# Patient Record
Sex: Male | Born: 1985 | Race: White | Hispanic: No | Marital: Married | Smoking: Never smoker
Health system: Southern US, Community
[De-identification: ages and names within clinical notes are randomized; demographics above are authoritative.]

## PROBLEM LIST (undated history)

## (undated) HISTORY — PX: SKIN SURGERY: SHX2413

---
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
## 2005-03-28 ENCOUNTER — Emergency Department (HOSPITAL_COMMUNITY): Admission: EM | Admit: 2005-03-28 | Discharge: 2005-03-29 | Payer: Self-pay | Admitting: Emergency Medicine

## 2006-03-06 ENCOUNTER — Ambulatory Visit (HOSPITAL_COMMUNITY): Admission: RE | Admit: 2006-03-06 | Discharge: 2006-03-06 | Payer: Self-pay | Admitting: Family Medicine

## 2006-11-12 IMAGING — CR DG NASAL BONES 3+V
3 series · 3 of 3 positions shown · non-contrast
Comparison: none

CLINICAL DATA: Injury.
 NASAL BONE SERIES:
 No fracture is identified.  Nose appears swollen at the bridge.

[view not recorded (1 of 3)]
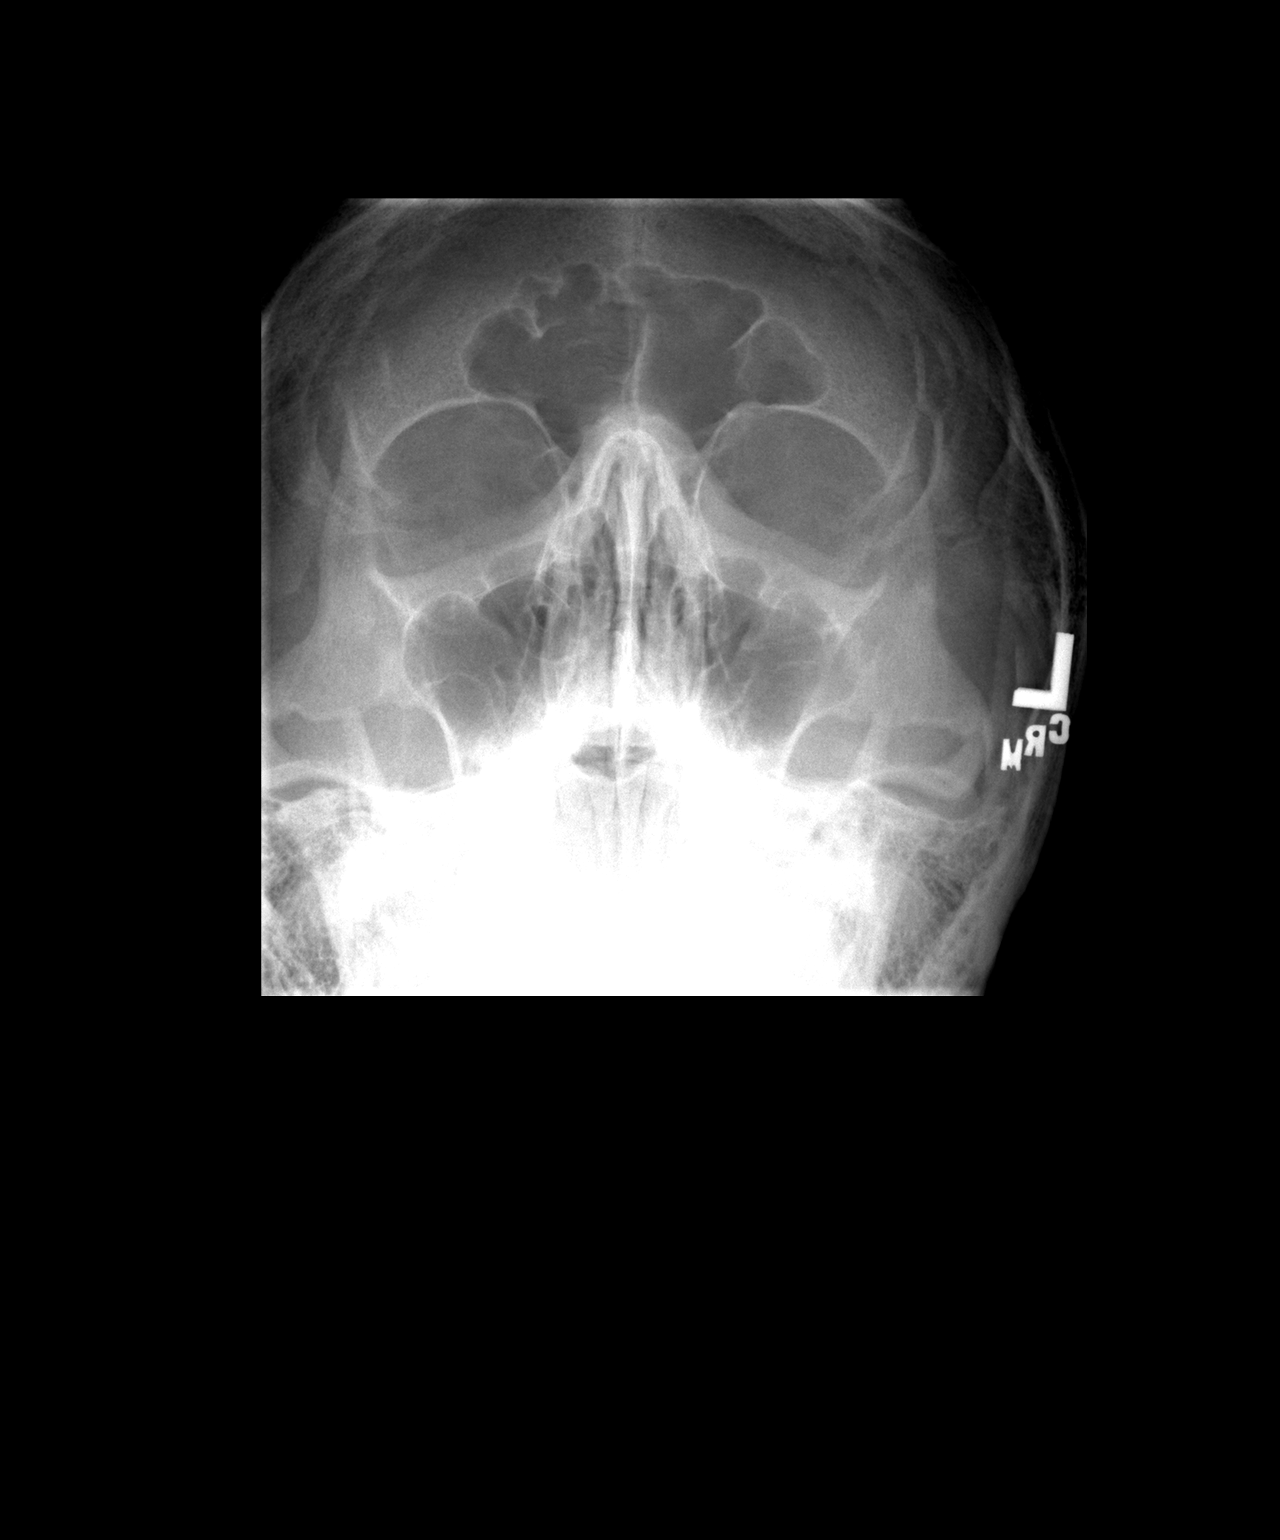

[view not recorded (2 of 3)]
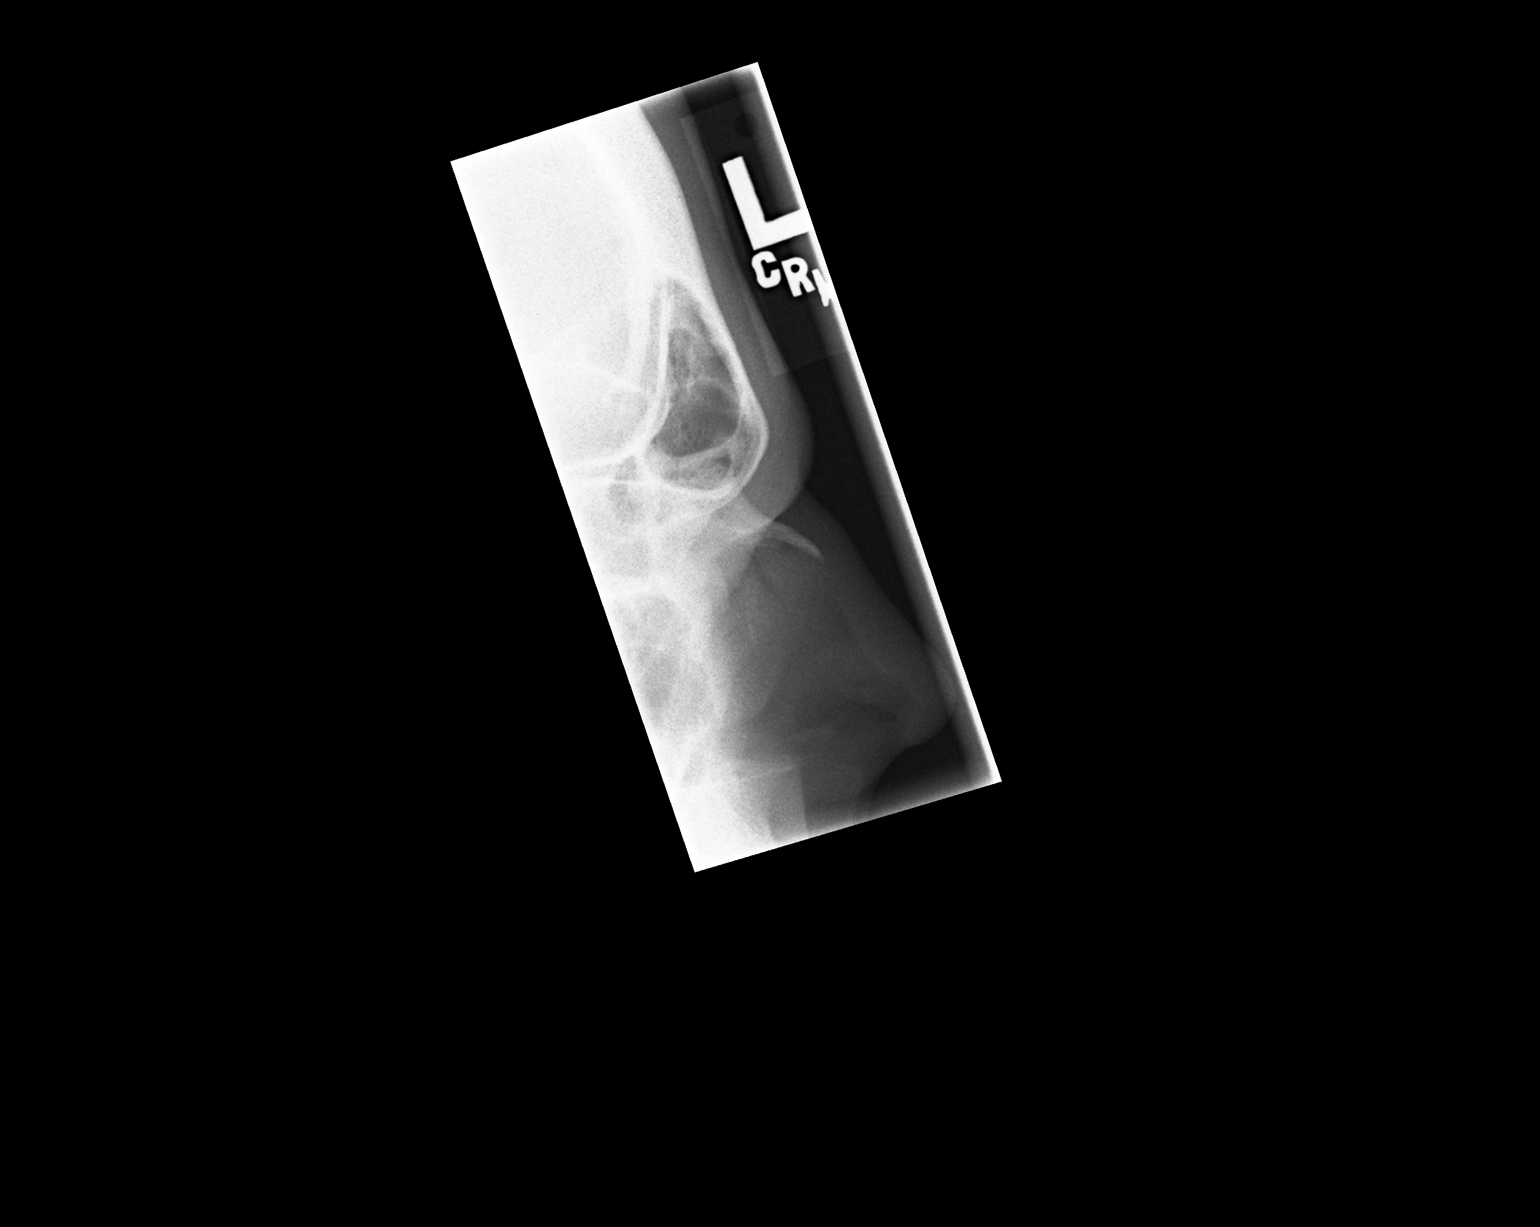

[view not recorded (3 of 3)]
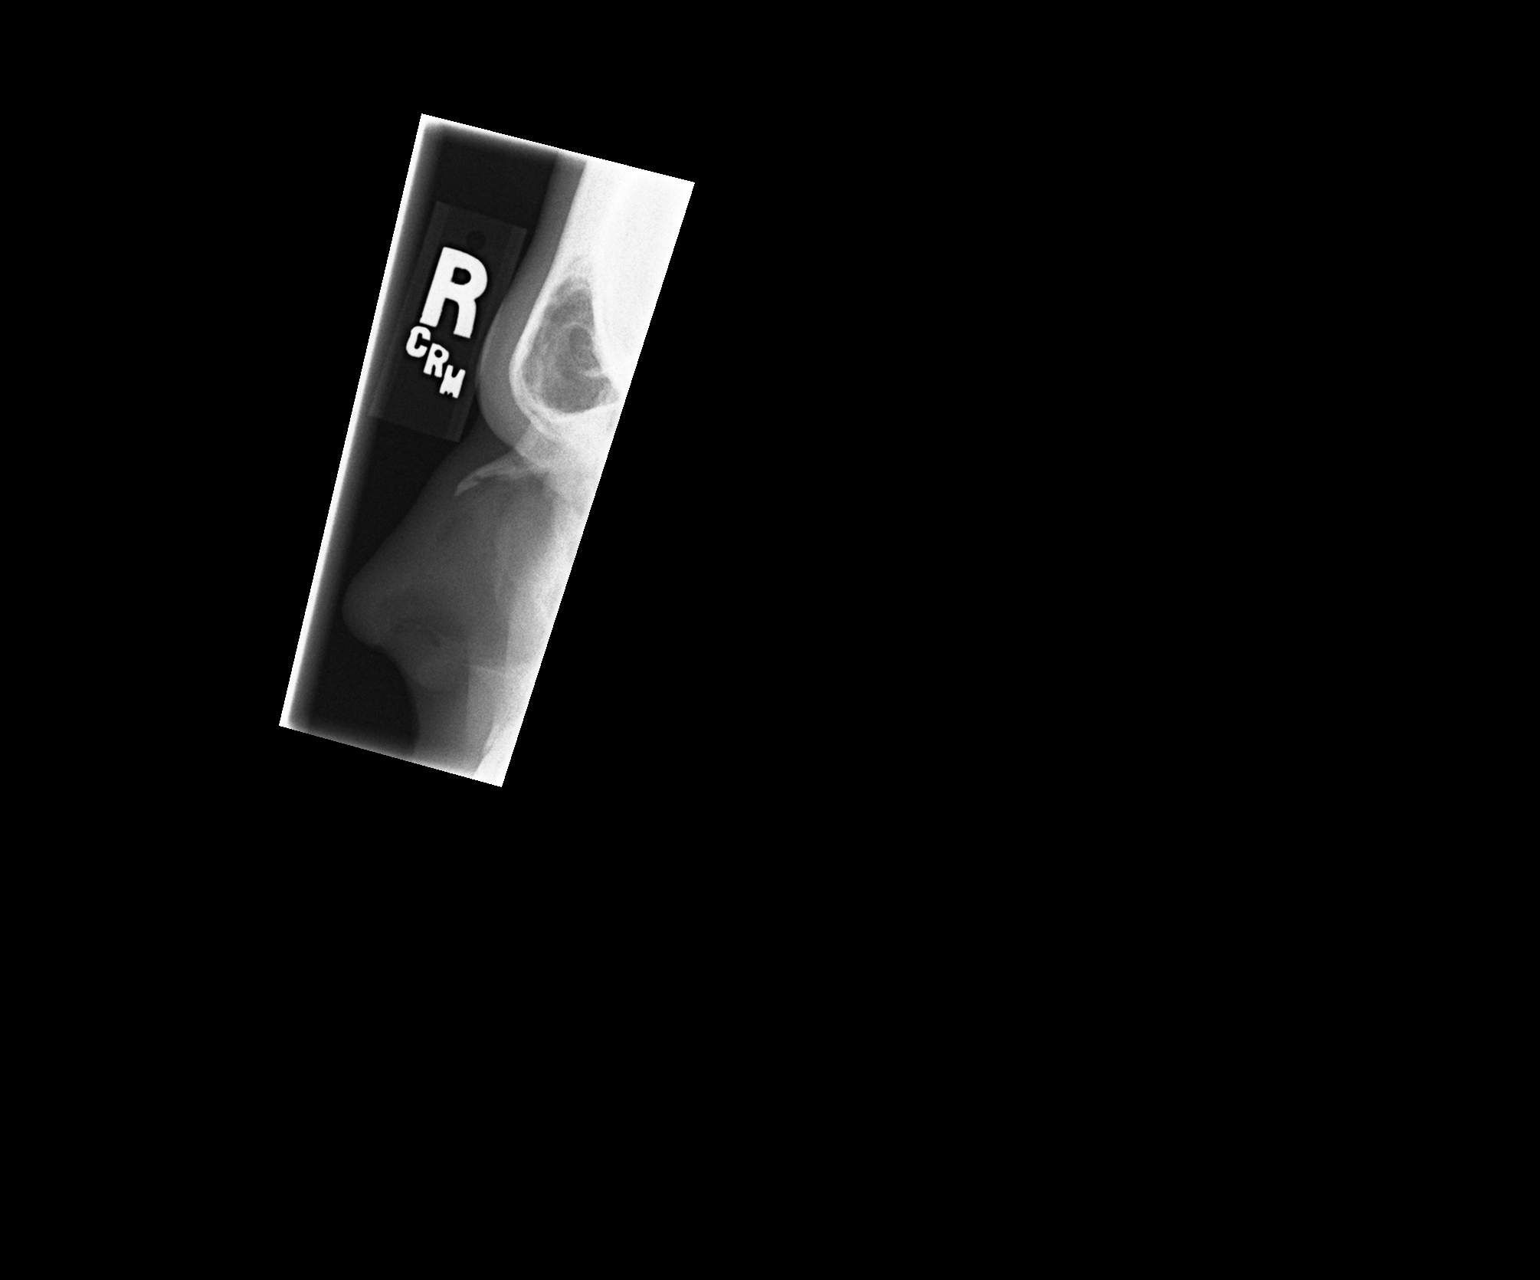

[3 of 3 positions shown; findings below may reference images not displayed]

IMPRESSION: Negative for fracture.

## 2011-04-04 ENCOUNTER — Other Ambulatory Visit (HOSPITAL_COMMUNITY): Payer: Self-pay | Admitting: Family Medicine

## 2011-04-04 DIAGNOSIS — R55 Syncope and collapse: Secondary | ICD-10-CM

## 2011-04-06 ENCOUNTER — Other Ambulatory Visit (HOSPITAL_COMMUNITY): Payer: Self-pay

## 2011-04-07 ENCOUNTER — Ambulatory Visit (HOSPITAL_COMMUNITY)
Admission: RE | Admit: 2011-04-07 | Discharge: 2011-04-07 | Disposition: A | Discharge status: Home / Self Care | Payer: BC Managed Care – PPO | Source: Ambulatory Visit | Attending: Family Medicine | Admitting: Family Medicine

## 2011-04-07 DIAGNOSIS — R42 Dizziness and giddiness: Secondary | ICD-10-CM | POA: Insufficient documentation

## 2011-04-07 DIAGNOSIS — R55 Syncope and collapse: Secondary | ICD-10-CM | POA: Insufficient documentation

## 2011-04-07 DIAGNOSIS — R51 Headache: Secondary | ICD-10-CM | POA: Insufficient documentation

## 2011-04-07 MED ORDER — GADOBENATE DIMEGLUMINE 529 MG/ML IV SOLN: 20.0000 mL | Freq: Once | INTRAVENOUS | Status: AC | PRN

## 2011-04-07 MED ORDER — GADOBENATE DIMEGLUMINE 529 MG/ML IV SOLN: 15.0000 mL | Freq: Once | INTRAVENOUS | Status: AC | PRN

## 2011-05-13 ENCOUNTER — Ambulatory Visit (HOSPITAL_COMMUNITY)
Admission: RE | Admit: 2011-05-13 | Payer: BC Managed Care – PPO | Source: Ambulatory Visit | Admitting: Cardiovascular Disease

## 2011-07-20 ENCOUNTER — Other Ambulatory Visit (HOSPITAL_COMMUNITY): Payer: Self-pay | Admitting: Family Medicine

## 2011-07-20 DIAGNOSIS — D485 Neoplasm of uncertain behavior of skin: Secondary | ICD-10-CM

## 2011-07-22 ENCOUNTER — Other Ambulatory Visit (HOSPITAL_COMMUNITY): Payer: Self-pay | Admitting: Family Medicine

## 2011-07-22 DIAGNOSIS — D485 Neoplasm of uncertain behavior of skin: Secondary | ICD-10-CM

## 2011-07-25 ENCOUNTER — Other Ambulatory Visit (HOSPITAL_COMMUNITY): Payer: BC Managed Care – PPO

## 2011-07-29 ENCOUNTER — Ambulatory Visit (HOSPITAL_COMMUNITY)
Admission: RE | Admit: 2011-07-29 | Discharge: 2011-07-29 | Disposition: A | Discharge status: Home / Self Care | Payer: BC Managed Care – PPO | Source: Ambulatory Visit | Attending: Family Medicine | Admitting: Family Medicine

## 2011-07-29 ENCOUNTER — Other Ambulatory Visit (HOSPITAL_COMMUNITY): Payer: Self-pay | Admitting: Family Medicine

## 2011-07-29 ENCOUNTER — Other Ambulatory Visit (HOSPITAL_COMMUNITY): Payer: BC Managed Care – PPO

## 2011-07-29 DIAGNOSIS — D485 Neoplasm of uncertain behavior of skin: Secondary | ICD-10-CM

## 2011-07-29 DIAGNOSIS — R1909 Other intra-abdominal and pelvic swelling, mass and lump: Secondary | ICD-10-CM | POA: Insufficient documentation

## 2011-08-02 ENCOUNTER — Other Ambulatory Visit (HOSPITAL_COMMUNITY): Payer: BC Managed Care – PPO

## 2012-08-01 DIAGNOSIS — G8918 Other acute postprocedural pain: Secondary | ICD-10-CM | POA: Insufficient documentation

## 2012-08-01 NOTE — ED Notes (Addendum)
Pt states that he had surgery on Tuesday at [facility] for removal of "fatty tissue", has two drains in place, pt states that the drain on the left side started pulling at the site of the stitches tonight and is not draining as much. Pt spoke with Dr. Odis Luster who advises that he would meet pt in office tomorrow to see about removing drains. Pt states that he did not think he could wait that long.  Pt states that he has been experiencing some sob today.

## 2012-08-02 ENCOUNTER — Encounter (HOSPITAL_COMMUNITY): Payer: Self-pay | Admitting: *Deleted

## 2012-08-02 ENCOUNTER — Emergency Department (HOSPITAL_COMMUNITY)
Admission: EM | Admit: 2012-08-02 | Discharge: 2012-08-02 | Disposition: A | Discharge status: Home / Self Care | Payer: BC Managed Care – PPO | Attending: Emergency Medicine | Admitting: Emergency Medicine

## 2012-08-02 DIAGNOSIS — G8918 Other acute postprocedural pain: Secondary | ICD-10-CM

## 2012-08-02 MED ORDER — HYDROCODONE-ACETAMINOPHEN 5-325 MG PO TABS
1.0000 | ORAL_TABLET | Freq: Once | ORAL | Status: AC
  Administered 2012-08-02: 1 via ORAL
  Filled 2012-08-02: qty 1

## 2012-08-02 NOTE — ED Notes (Signed)
Pt states some increased abd pain, states it has been a while since his last dose of pain medication before he came here. Dr Deretha Emory aware and in room assessing pt at this time.

## 2012-08-02 NOTE — ED Provider Notes (Signed)
History     CSN: [id number]  Arrival date & time 08/01/12  2354   First MD Initiated Contact with Patient 08/02/12 [phone]      Chief Complaint  Patient presents with  . JP drain issue     (Consider location/radiation/quality/duration/timing/severity/associated sxs/prior treatment) The history is provided by the patient and a parent.  patient is a 26 year old male status post plastic surgery to the chest on Tuesday by Dr. Odis Luster plastic surgeon in [place]. Patient has a compression dressing in place and 2 JP drains. Patient complaining of pain where the left JP drain is. No fever no trouble breathing no shortness of breath no bowel pain no nausea no vomiting. Pain is described as 5/10 , sharp and intermittent. Both JP drains are still functioning.  History reviewed. No pertinent past medical history.  Past Surgical History  Procedure Date  . Skin surgery     removal of fatty tissue.     No family history on file.  History  Substance Use Topics  . Smoking status: Never Smoker   . Smokeless tobacco: Not on file  . Alcohol Use: No      Review of Systems  Constitutional: Negative for fever.  HENT: Negative for neck pain.   Respiratory: Negative for shortness of breath.   Cardiovascular: Positive for chest pain.  Gastrointestinal: Negative for nausea, vomiting and abdominal pain.  Genitourinary: Negative for dysuria.  Musculoskeletal: Negative for back pain.  Skin: Negative for rash.  Neurological: Negative for headaches.  Hematological: Does not bruise/bleed easily.    Allergies  Review of patient's allergies indicates no known allergies.  Home Medications   Current Outpatient Rx  Name Route Sig Dispense Refill  . ACETAMINOPHEN 500 MG PO TABS Oral Take 500 mg by mouth as needed.    Marland Kitchen HYDROMORPHONE HCL 2 MG PO TABS Oral Take 2 mg by mouth every 4 (four) hours as needed.    . METHOCARBAMOL 500 MG PO TABS Oral Take 500 mg by mouth as needed.      BP 103/73   Pulse 83  Temp 98.9 F (37.2 C)  Resp 20  Ht 6' (1.829 m)  Wt 193 lb (87.544 kg)  BMI 26.18 kg/m2  SpO2 96%  Physical Exam  Nursing note and vitals reviewed. Constitutional: He is oriented to person, place, and time. He appears well-developed and well-nourished.  HENT:  Head: Normocephalic and atraumatic.  Mouth/Throat: Oropharynx is clear and moist.  Eyes: Conjunctivae and EOM are normal. Pupils are equal, round, and reactive to light.  Neck: Normal range of motion. Neck supple.  Cardiovascular: Normal rate, regular rhythm and normal heart sounds.   No murmur heard. Pulmonary/Chest: Effort normal. No respiratory distress. He has no wheezes. He has no rales.       Compression dressing around the chest. No blood drainage on the dressing. Both JP drains in place and functioning. No erythema on the edges of the dressing.  Abdominal: Soft. Bowel sounds are normal.  Musculoskeletal: Normal range of motion.  Neurological: He is alert and oriented to person, place, and time. No cranial nerve deficit. He exhibits normal muscle tone. Coordination normal.  Skin: No erythema.    ED Course  Procedures (including critical care time)  Labs Reviewed - No data to display No results found.   1. Chest wall pain following surgery       MDM   Patient status post chest wall breast modification by plastic surgery just on Tuesday less than 2 days  ago. 2 JP drains in place compression dressing in place. Patient complaining of discomfort to the left side both drains are still draining properly. No fever lungs are clear no obvious evidence of infection however dressing not removed due to the recent surgery and patient has followup with his plastic surgeon later on today.  Patient was given hydromorphone to take for pain however is only taking Tylenol recommended he take that as directed we'll supplement with a dose of hydrocodone here before going home.        Shelda Jakes,  MD 08/02/12 [phone]

## 2013-03-14 IMAGING — US US ABDOMEN LIMITED
1 series · 2 of 2 positions shown · non-contrast
Comparison: None

CLINICAL DATA: [DATE], lump in anterior abdominal wall left of
midline, history of skin cancer

LIMITED ABDOMINAL ULTRASOUND

[Series 1: us abdomen limited · 0.08mm/px · 2 of 2 slices shown]
[im 1/2]
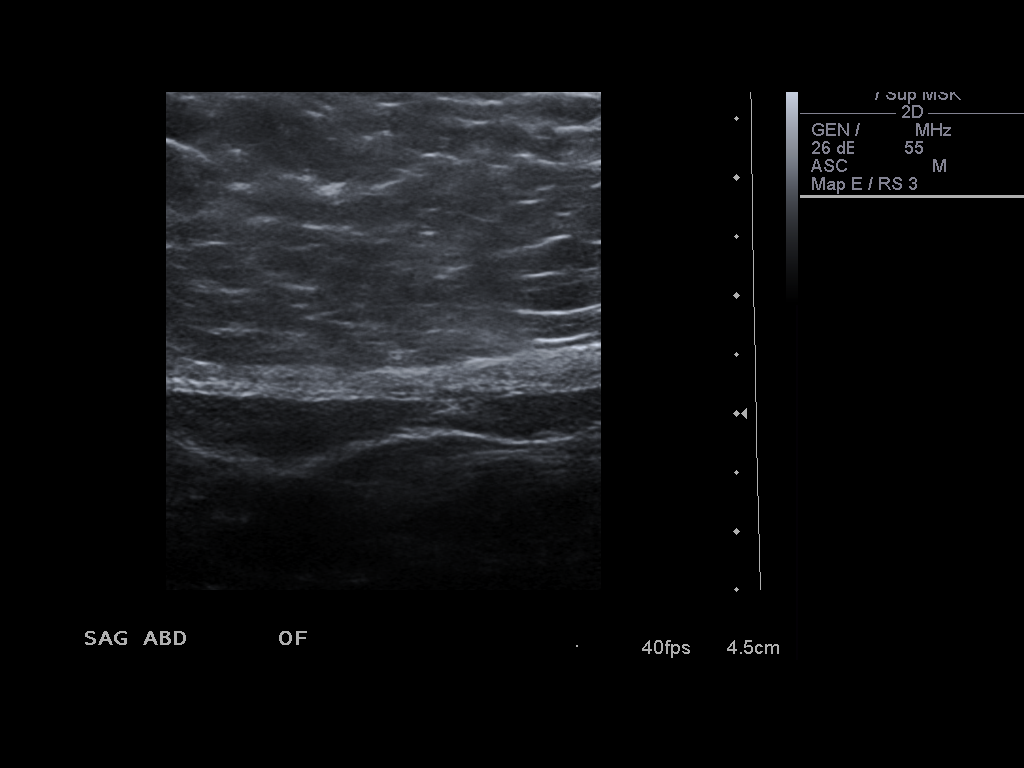
[im 2/2]
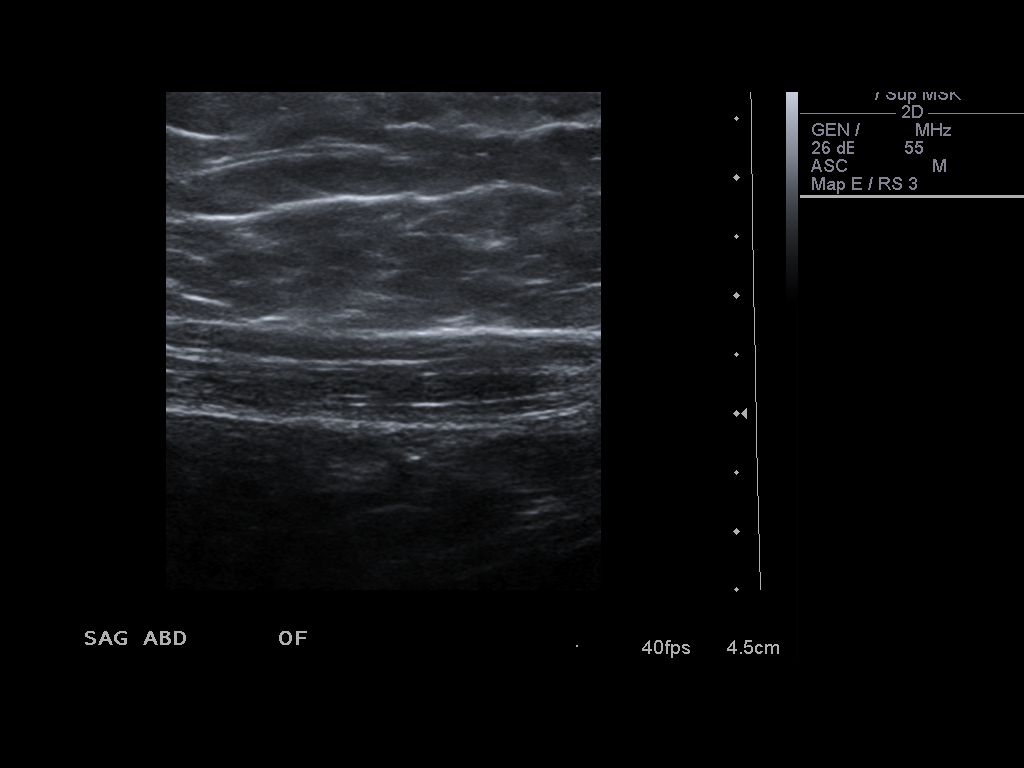

[2 of 2 positions shown; findings below may reference images not displayed]

FINDINGS: Sonography of the area of clinical concern left of the umbilicus
performed.
At this site, no focal soft tissue abnormality identified.
No evidence of calcification, cyst, or soft tissue mass.
Subcutaneous fat planes appear unremarkable and underlying fascia
appears intact.
IMPRESSION: Negative ultrasound exam of the site of clinical concern at the
anterior abdominal wall left of the umbilicus.

## 2013-05-20 ENCOUNTER — Encounter (INDEPENDENT_AMBULATORY_CARE_PROVIDER_SITE_OTHER): Payer: BC Managed Care – PPO | Admitting: Ophthalmology

## 2013-05-20 DIAGNOSIS — H35419 Lattice degeneration of retina, unspecified eye: Secondary | ICD-10-CM

## 2013-05-20 DIAGNOSIS — Q143 Congenital malformation of choroid: Secondary | ICD-10-CM

## 2013-05-20 DIAGNOSIS — H33309 Unspecified retinal break, unspecified eye: Secondary | ICD-10-CM

## 2013-05-20 DIAGNOSIS — H43819 Vitreous degeneration, unspecified eye: Secondary | ICD-10-CM

## 2013-05-24 ENCOUNTER — Encounter (INDEPENDENT_AMBULATORY_CARE_PROVIDER_SITE_OTHER): Payer: BC Managed Care – PPO | Admitting: Ophthalmology

## 2013-05-24 DIAGNOSIS — H33309 Unspecified retinal break, unspecified eye: Secondary | ICD-10-CM

## 2013-06-14 ENCOUNTER — Ambulatory Visit (INDEPENDENT_AMBULATORY_CARE_PROVIDER_SITE_OTHER): Payer: BC Managed Care – PPO | Admitting: Ophthalmology

## 2013-06-14 DIAGNOSIS — H33309 Unspecified retinal break, unspecified eye: Secondary | ICD-10-CM

## 2013-10-18 ENCOUNTER — Ambulatory Visit (INDEPENDENT_AMBULATORY_CARE_PROVIDER_SITE_OTHER): Payer: BC Managed Care – PPO | Admitting: Ophthalmology

## 2013-11-15 ENCOUNTER — Ambulatory Visit (INDEPENDENT_AMBULATORY_CARE_PROVIDER_SITE_OTHER): Payer: BC Managed Care – PPO | Admitting: Ophthalmology

## 2013-11-15 DIAGNOSIS — H33309 Unspecified retinal break, unspecified eye: Secondary | ICD-10-CM

## 2013-11-15 DIAGNOSIS — H43819 Vitreous degeneration, unspecified eye: Secondary | ICD-10-CM

## 2014-12-05 ENCOUNTER — Ambulatory Visit (INDEPENDENT_AMBULATORY_CARE_PROVIDER_SITE_OTHER): Payer: BLUE CROSS/BLUE SHIELD | Admitting: Ophthalmology

## 2014-12-05 DIAGNOSIS — H33303 Unspecified retinal break, bilateral: Secondary | ICD-10-CM

## 2014-12-05 DIAGNOSIS — H35413 Lattice degeneration of retina, bilateral: Secondary | ICD-10-CM

## 2014-12-05 DIAGNOSIS — H43813 Vitreous degeneration, bilateral: Secondary | ICD-10-CM

## 2014-12-05 DIAGNOSIS — D3132 Benign neoplasm of left choroid: Secondary | ICD-10-CM

## 2015-12-11 ENCOUNTER — Ambulatory Visit (INDEPENDENT_AMBULATORY_CARE_PROVIDER_SITE_OTHER): Payer: BLUE CROSS/BLUE SHIELD | Admitting: Ophthalmology

## 2016-01-01 ENCOUNTER — Ambulatory Visit (INDEPENDENT_AMBULATORY_CARE_PROVIDER_SITE_OTHER): Payer: BLUE CROSS/BLUE SHIELD | Admitting: Ophthalmology

## 2016-01-01 DIAGNOSIS — H43813 Vitreous degeneration, bilateral: Secondary | ICD-10-CM

## 2016-01-01 DIAGNOSIS — D3132 Benign neoplasm of left choroid: Secondary | ICD-10-CM | POA: Diagnosis not present

## 2016-01-01 DIAGNOSIS — H33303 Unspecified retinal break, bilateral: Secondary | ICD-10-CM | POA: Diagnosis not present

## 2016-01-01 DIAGNOSIS — H35413 Lattice degeneration of retina, bilateral: Secondary | ICD-10-CM

## 2016-04-15 DIAGNOSIS — J069 Acute upper respiratory infection, unspecified: Secondary | ICD-10-CM | POA: Diagnosis not present

## 2016-04-15 DIAGNOSIS — Z1389 Encounter for screening for other disorder: Secondary | ICD-10-CM | POA: Diagnosis not present

## 2016-04-15 DIAGNOSIS — R070 Pain in throat: Secondary | ICD-10-CM | POA: Diagnosis not present

## 2016-04-15 DIAGNOSIS — Z6826 Body mass index (BMI) 26.0-26.9, adult: Secondary | ICD-10-CM | POA: Diagnosis not present

## 2016-04-15 DIAGNOSIS — J343 Hypertrophy of nasal turbinates: Secondary | ICD-10-CM | POA: Diagnosis not present

## 2017-01-13 ENCOUNTER — Ambulatory Visit (INDEPENDENT_AMBULATORY_CARE_PROVIDER_SITE_OTHER): Payer: BLUE CROSS/BLUE SHIELD | Admitting: Ophthalmology

## 2017-01-13 DIAGNOSIS — H33303 Unspecified retinal break, bilateral: Secondary | ICD-10-CM | POA: Diagnosis not present

## 2017-01-13 DIAGNOSIS — D3132 Benign neoplasm of left choroid: Secondary | ICD-10-CM

## 2017-01-13 DIAGNOSIS — H43813 Vitreous degeneration, bilateral: Secondary | ICD-10-CM

## 2017-01-31 DIAGNOSIS — Z1389 Encounter for screening for other disorder: Secondary | ICD-10-CM | POA: Diagnosis not present

## 2017-01-31 DIAGNOSIS — Z6826 Body mass index (BMI) 26.0-26.9, adult: Secondary | ICD-10-CM | POA: Diagnosis not present

## 2017-01-31 DIAGNOSIS — Z0001 Encounter for general adult medical examination with abnormal findings: Secondary | ICD-10-CM | POA: Diagnosis not present

## 2017-01-31 DIAGNOSIS — E663 Overweight: Secondary | ICD-10-CM | POA: Diagnosis not present

## 2017-02-02 DIAGNOSIS — E663 Overweight: Secondary | ICD-10-CM | POA: Diagnosis not present

## 2017-02-02 DIAGNOSIS — J069 Acute upper respiratory infection, unspecified: Secondary | ICD-10-CM | POA: Diagnosis not present

## 2017-02-02 DIAGNOSIS — Z6827 Body mass index (BMI) 27.0-27.9, adult: Secondary | ICD-10-CM | POA: Diagnosis not present

## 2017-03-28 DIAGNOSIS — J069 Acute upper respiratory infection, unspecified: Secondary | ICD-10-CM | POA: Diagnosis not present

## 2017-08-11 DIAGNOSIS — F419 Anxiety disorder, unspecified: Secondary | ICD-10-CM | POA: Diagnosis not present

## 2017-08-11 DIAGNOSIS — Z1389 Encounter for screening for other disorder: Secondary | ICD-10-CM | POA: Diagnosis not present

## 2017-08-11 DIAGNOSIS — Z6827 Body mass index (BMI) 27.0-27.9, adult: Secondary | ICD-10-CM | POA: Diagnosis not present

## 2017-08-11 DIAGNOSIS — E663 Overweight: Secondary | ICD-10-CM | POA: Diagnosis not present

## 2018-01-29 ENCOUNTER — Ambulatory Visit (INDEPENDENT_AMBULATORY_CARE_PROVIDER_SITE_OTHER): Payer: BLUE CROSS/BLUE SHIELD | Admitting: Ophthalmology

## 2018-01-29 DIAGNOSIS — H33303 Unspecified retinal break, bilateral: Secondary | ICD-10-CM | POA: Diagnosis not present

## 2018-01-29 DIAGNOSIS — D3132 Benign neoplasm of left choroid: Secondary | ICD-10-CM | POA: Diagnosis not present

## 2018-01-29 DIAGNOSIS — H43813 Vitreous degeneration, bilateral: Secondary | ICD-10-CM | POA: Diagnosis not present

## 2018-06-13 DIAGNOSIS — R197 Diarrhea, unspecified: Secondary | ICD-10-CM | POA: Diagnosis not present

## 2018-06-13 DIAGNOSIS — Z1389 Encounter for screening for other disorder: Secondary | ICD-10-CM | POA: Diagnosis not present

## 2018-06-13 DIAGNOSIS — Z6828 Body mass index (BMI) 28.0-28.9, adult: Secondary | ICD-10-CM | POA: Diagnosis not present

## 2018-06-13 DIAGNOSIS — E663 Overweight: Secondary | ICD-10-CM | POA: Diagnosis not present

## 2018-08-14 DIAGNOSIS — S338XXA Sprain of other parts of lumbar spine and pelvis, initial encounter: Secondary | ICD-10-CM | POA: Diagnosis not present

## 2018-08-14 DIAGNOSIS — S233XXA Sprain of ligaments of thoracic spine, initial encounter: Secondary | ICD-10-CM | POA: Diagnosis not present

## 2018-08-14 DIAGNOSIS — S134XXA Sprain of ligaments of cervical spine, initial encounter: Secondary | ICD-10-CM | POA: Diagnosis not present

## 2018-08-21 DIAGNOSIS — S233XXA Sprain of ligaments of thoracic spine, initial encounter: Secondary | ICD-10-CM | POA: Diagnosis not present

## 2018-08-21 DIAGNOSIS — S134XXA Sprain of ligaments of cervical spine, initial encounter: Secondary | ICD-10-CM | POA: Diagnosis not present

## 2018-08-21 DIAGNOSIS — S338XXA Sprain of other parts of lumbar spine and pelvis, initial encounter: Secondary | ICD-10-CM | POA: Diagnosis not present

## 2018-08-22 DIAGNOSIS — Z0001 Encounter for general adult medical examination with abnormal findings: Secondary | ICD-10-CM | POA: Diagnosis not present

## 2018-08-22 DIAGNOSIS — Z6829 Body mass index (BMI) 29.0-29.9, adult: Secondary | ICD-10-CM | POA: Diagnosis not present

## 2018-08-22 DIAGNOSIS — R001 Bradycardia, unspecified: Secondary | ICD-10-CM | POA: Diagnosis not present

## 2018-08-22 DIAGNOSIS — F419 Anxiety disorder, unspecified: Secondary | ICD-10-CM | POA: Diagnosis not present

## 2018-08-22 DIAGNOSIS — E663 Overweight: Secondary | ICD-10-CM | POA: Diagnosis not present

## 2018-08-22 DIAGNOSIS — Z23 Encounter for immunization: Secondary | ICD-10-CM | POA: Diagnosis not present

## 2018-08-22 DIAGNOSIS — Z1389 Encounter for screening for other disorder: Secondary | ICD-10-CM | POA: Diagnosis not present

## 2018-08-22 DIAGNOSIS — R7309 Other abnormal glucose: Secondary | ICD-10-CM | POA: Diagnosis not present

## 2018-08-22 DIAGNOSIS — Z0000 Encounter for general adult medical examination without abnormal findings: Secondary | ICD-10-CM | POA: Diagnosis not present

## 2018-09-04 DIAGNOSIS — S338XXA Sprain of other parts of lumbar spine and pelvis, initial encounter: Secondary | ICD-10-CM | POA: Diagnosis not present

## 2018-09-04 DIAGNOSIS — S233XXA Sprain of ligaments of thoracic spine, initial encounter: Secondary | ICD-10-CM | POA: Diagnosis not present

## 2018-09-04 DIAGNOSIS — S134XXA Sprain of ligaments of cervical spine, initial encounter: Secondary | ICD-10-CM | POA: Diagnosis not present

## 2018-09-25 DIAGNOSIS — S338XXA Sprain of other parts of lumbar spine and pelvis, initial encounter: Secondary | ICD-10-CM | POA: Diagnosis not present

## 2018-09-25 DIAGNOSIS — S233XXA Sprain of ligaments of thoracic spine, initial encounter: Secondary | ICD-10-CM | POA: Diagnosis not present

## 2018-09-25 DIAGNOSIS — S134XXA Sprain of ligaments of cervical spine, initial encounter: Secondary | ICD-10-CM | POA: Diagnosis not present

## 2018-10-08 DIAGNOSIS — D225 Melanocytic nevi of trunk: Secondary | ICD-10-CM | POA: Diagnosis not present

## 2018-10-08 DIAGNOSIS — D1809 Hemangioma of other sites: Secondary | ICD-10-CM | POA: Diagnosis not present

## 2018-10-08 DIAGNOSIS — D485 Neoplasm of uncertain behavior of skin: Secondary | ICD-10-CM | POA: Diagnosis not present

## 2018-10-08 DIAGNOSIS — L570 Actinic keratosis: Secondary | ICD-10-CM | POA: Diagnosis not present

## 2018-10-08 DIAGNOSIS — D1801 Hemangioma of skin and subcutaneous tissue: Secondary | ICD-10-CM | POA: Diagnosis not present

## 2019-01-30 ENCOUNTER — Encounter (INDEPENDENT_AMBULATORY_CARE_PROVIDER_SITE_OTHER): Payer: BLUE CROSS/BLUE SHIELD | Admitting: Ophthalmology

## 2019-08-08 DIAGNOSIS — S233XXA Sprain of ligaments of thoracic spine, initial encounter: Secondary | ICD-10-CM | POA: Diagnosis not present

## 2019-08-08 DIAGNOSIS — S338XXA Sprain of other parts of lumbar spine and pelvis, initial encounter: Secondary | ICD-10-CM | POA: Diagnosis not present

## 2019-08-08 DIAGNOSIS — S134XXA Sprain of ligaments of cervical spine, initial encounter: Secondary | ICD-10-CM | POA: Diagnosis not present

## 2019-10-15 DIAGNOSIS — D224 Melanocytic nevi of scalp and neck: Secondary | ICD-10-CM | POA: Diagnosis not present

## 2019-10-15 DIAGNOSIS — D1801 Hemangioma of skin and subcutaneous tissue: Secondary | ICD-10-CM | POA: Diagnosis not present

## 2019-10-15 DIAGNOSIS — L570 Actinic keratosis: Secondary | ICD-10-CM | POA: Diagnosis not present

## 2019-10-15 DIAGNOSIS — L819 Disorder of pigmentation, unspecified: Secondary | ICD-10-CM | POA: Diagnosis not present

## 2019-10-15 DIAGNOSIS — D239 Other benign neoplasm of skin, unspecified: Secondary | ICD-10-CM | POA: Diagnosis not present

## 2019-10-15 DIAGNOSIS — D485 Neoplasm of uncertain behavior of skin: Secondary | ICD-10-CM | POA: Diagnosis not present

## 2020-01-22 DIAGNOSIS — Z23 Encounter for immunization: Secondary | ICD-10-CM | POA: Diagnosis not present

## 2020-02-19 DIAGNOSIS — Z23 Encounter for immunization: Secondary | ICD-10-CM | POA: Diagnosis not present

## 2020-07-21 DIAGNOSIS — H33333 Multiple defects of retina without detachment, bilateral: Secondary | ICD-10-CM | POA: Diagnosis not present

## 2020-10-14 DIAGNOSIS — I781 Nevus, non-neoplastic: Secondary | ICD-10-CM | POA: Diagnosis not present

## 2020-10-21 DIAGNOSIS — J069 Acute upper respiratory infection, unspecified: Secondary | ICD-10-CM | POA: Diagnosis not present

## 2021-01-19 DIAGNOSIS — J019 Acute sinusitis, unspecified: Secondary | ICD-10-CM | POA: Diagnosis not present

## 2021-04-11 ENCOUNTER — Other Ambulatory Visit: Payer: Self-pay

## 2021-04-11 ENCOUNTER — Ambulatory Visit
Admission: RE | Admit: 2021-04-11 | Discharge: 2021-04-11 | Disposition: A | Discharge status: Home / Self Care | Payer: BC Managed Care – PPO | Source: Ambulatory Visit | Attending: Family Medicine | Admitting: Family Medicine

## 2021-04-11 VITALS — BP 142/81 | HR 96 | Temp 98.1°F | Resp 16

## 2021-04-11 DIAGNOSIS — J209 Acute bronchitis, unspecified: Secondary | ICD-10-CM | POA: Diagnosis not present

## 2021-04-11 DIAGNOSIS — J069 Acute upper respiratory infection, unspecified: Secondary | ICD-10-CM

## 2021-04-11 DIAGNOSIS — R059 Cough, unspecified: Secondary | ICD-10-CM

## 2021-04-11 MED ORDER — AMOXICILLIN-POT CLAVULANATE 875-125 MG PO TABS: 1.0000 | ORAL_TABLET | Freq: Two times a day (BID) | ORAL | 0 refills | Status: AC

## 2021-04-11 MED ORDER — BENZONATATE 100 MG PO CAPS: 100.0000 mg | ORAL_CAPSULE | Freq: Three times a day (TID) | ORAL | 0 refills | Status: DC

## 2021-04-11 NOTE — Discharge Instructions (Signed)
I have sent in Augmentin for you to take twice a day for 7 days.  I have sent in [place] for you to use one capsule every 8 hours as needed for cough.  Follow up with this office or with primary care if symptoms are persisting.  Follow up in the ER for high fever, trouble swallowing, trouble breathing, other concerning symptoms.

## 2021-04-11 NOTE — ED Provider Notes (Signed)
[facility]    CSN: [id number] Arrival date & time: 04/11/21  1348      History   Chief Complaint No chief complaint on file.   HPI David Melton is a 35 y.o. male.   Reports that he has had a cough, rhinorrhea, sinus pressure for the last 4 to 5 days.  Has been taking OTC cough and cold with no relief.  Has positive history of COVID.  Has not completed COVID vaccines.  Has completed flu vaccine.  Reports that he went to the beach with his family over the last week.  States that his parents are also sick.  Also states that his wife and his son are sick as well.  States that his son has an ear infection.  States that both parents were treated with antibiotics for URI.  Denies fever, body aches, chills, nausea, vomiting, diarrhea, rash, other symptoms.  ROS per HPI  The history is provided by the patient.    History reviewed. No pertinent past medical history.  There are no problems to display for this patient.   Past Surgical History:  Procedure Laterality Date  . SKIN SURGERY     removal of fatty tissue.        Home Medications    Prior to Admission medications   Medication Sig Start Date End Date Taking? Authorizing Provider  amoxicillin-clavulanate (AUGMENTIN) 875-125 MG tablet Take 1 tablet by mouth 2 (two) times daily for 7 days. 04/11/21 04/18/21 Yes Faustino Congress, NP  benzonatate (TESSALON) 100 MG capsule Take 1 capsule (100 mg total) by mouth every 8 (eight) hours. 04/11/21  Yes Faustino Congress, NP  acetaminophen (TYLENOL) 500 MG tablet Take 500 mg by mouth as needed.    [provider]  guaiFENesin-codeine (ROBITUSSIN AC) 100-10 MG/5ML syrup Take 5 mLs by mouth 3 (three) times daily as needed for cough. 04/13/21   Faustino Congress, NP  HYDROmorphone (DILAUDID) 2 MG tablet Take 2 mg by mouth every 4 (four) hours as needed.    [provider]  methocarbamol (ROBAXIN) 500 MG tablet Take 500 mg by mouth as needed.     [provider]    Family History Family History  Problem Relation Age of Onset  . Healthy Mother   . Kidney disease Father   . Heart failure Father     Social History Social History   Tobacco Use  . Smoking status: Never Smoker  . Smokeless tobacco: Never Used  Substance Use Topics  . Alcohol use: No  . Drug use: No     Allergies   Patient has no known allergies.   Review of Systems Review of Systems   Physical Exam Triage Vital Signs ED Triage Vitals  Enc Vitals Group     BP 04/11/21 1406 (!) 142/81     Pulse Rate 04/11/21 1406 96     Resp 04/11/21 1406 16     Temp 04/11/21 1406 98.1 F (36.7 C)     Temp Source 04/11/21 1406 Oral     SpO2 04/11/21 1406 96 %     Weight --      Height --      Head Circumference --      Peak Flow --      Pain Score 04/11/21 1412 1     Pain Loc --      Pain Edu? --      Excl. in [place]? --    No data found.  Updated Vital  Signs BP (!) 142/81 (BP Location: Right Arm)   Pulse 96   Temp 98.1 F (36.7 C) (Oral)   Resp 16   SpO2 96%   Visual Acuity Right Eye Distance:   Left Eye Distance:   Bilateral Distance:    Right Eye Near:   Left Eye Near:    Bilateral Near:     Physical Exam Vitals and nursing note reviewed.  Constitutional:      General: He is not in acute distress.    Appearance: Normal appearance. He is well-developed and normal weight. He is ill-appearing.  HENT:     Head: Normocephalic and atraumatic.     Right Ear: Tympanic membrane, ear canal and external ear normal.     Left Ear: Tympanic membrane, ear canal and external ear normal.     Nose: Congestion present.     Mouth/Throat:     Mouth: Mucous membranes are moist.     Pharynx: Posterior oropharyngeal erythema present.  Eyes:     Extraocular Movements: Extraocular movements intact.     Conjunctiva/sclera: Conjunctivae normal.     Pupils: Pupils are equal, round, and reactive to light.  Cardiovascular:     Rate and Rhythm: Normal  rate and regular rhythm.     Heart sounds: Normal heart sounds. No murmur heard.   Pulmonary:     Effort: Pulmonary effort is normal. No respiratory distress.     Breath sounds: Normal breath sounds. No stridor. No wheezing, rhonchi or rales.  Chest:     Chest wall: No tenderness.  Abdominal:     General: Bowel sounds are normal.     Palpations: Abdomen is soft.     Tenderness: There is no abdominal tenderness.  Musculoskeletal:        General: Normal range of motion.     Cervical back: Normal range of motion and neck supple.  Lymphadenopathy:     Cervical: Cervical adenopathy present.  Skin:    General: Skin is warm and dry.     Capillary Refill: Capillary refill takes less than 2 seconds.  Neurological:     General: No focal deficit present.     Mental Status: He is alert and oriented to person, place, and time.  Psychiatric:        Mood and Affect: Mood normal.        Behavior: Behavior normal.        Thought Content: Thought content normal.      UC Treatments / Results  Labs (all labs ordered are listed, but only abnormal results are displayed) Labs Reviewed - No data to display  EKG   Radiology No results found.  Procedures Procedures (including critical care time)  Medications Ordered in UC Medications - No data to display  Initial Impression / Assessment and Plan / UC Course  I have reviewed the triage vital signs and the nursing notes.  Pertinent labs & imaging results that were available during my care of the patient were reviewed by me and considered in my medical decision making (see chart for details).    URI Acute bronchitis  Augmentin prescribed 875 twice daily x7 days Tessalon Perles prescribed as needed cough Declines COVID and flu testing today Follow up with this office or with primary care if symptoms are persisting. Follow up in the ER for high fever, trouble swallowing, trouble breathing, other concerning symptoms.   Final Clinical  Impressions(s) / UC Diagnoses   Final diagnoses:  Cough  Acute bronchitis, unspecified  organism  Acute URI     Discharge Instructions     I have sent in Augmentin for you to take twice a day for 7 days.  I have sent in [place] for you to use one capsule every 8 hours as needed for cough.  Follow up with this office or with primary care if symptoms are persisting.  Follow up in the ER for high fever, trouble swallowing, trouble breathing, other concerning symptoms.     ED Prescriptions    Medication Sig Dispense Auth. Provider   amoxicillin-clavulanate (AUGMENTIN) 875-125 MG tablet Take 1 tablet by mouth 2 (two) times daily for 7 days. 14 tablet Faustino Congress, NP   benzonatate (TESSALON) 100 MG capsule Take 1 capsule (100 mg total) by mouth every 8 (eight) hours. 21 capsule Faustino Congress, NP     PDMP not reviewed this encounter.   Faustino Congress, NP 04/14/21 [phone]

## 2021-04-11 NOTE — ED Triage Notes (Signed)
Cough, runny nose, head pressure since Thursday.

## 2021-04-13 ENCOUNTER — Other Ambulatory Visit: Payer: Self-pay

## 2021-04-13 ENCOUNTER — Ambulatory Visit
Admission: RE | Admit: 2021-04-13 | Discharge: 2021-04-13 | Disposition: A | Discharge status: Home / Self Care | Payer: BC Managed Care – PPO | Source: Ambulatory Visit | Attending: Family Medicine | Admitting: Family Medicine

## 2021-04-13 VITALS — BP 125/81 | HR 90 | Temp 97.8°F | Resp 18

## 2021-04-13 DIAGNOSIS — B349 Viral infection, unspecified: Secondary | ICD-10-CM | POA: Diagnosis not present

## 2021-04-13 DIAGNOSIS — R432 Parageusia: Secondary | ICD-10-CM

## 2021-04-13 DIAGNOSIS — R430 Anosmia: Secondary | ICD-10-CM

## 2021-04-13 DIAGNOSIS — R059 Cough, unspecified: Secondary | ICD-10-CM

## 2021-04-13 MED ORDER — GUAIFENESIN-CODEINE 100-10 MG/5ML PO SYRP: 5.0000 mL | ORAL_SOLUTION | Freq: Three times a day (TID) | ORAL | 0 refills | Status: DC | PRN

## 2021-04-13 MED ORDER — DEXAMETHASONE SODIUM PHOSPHATE 10 MG/ML IJ SOLN
10.0000 mg | Freq: Once | INTRAMUSCULAR | Status: AC
  Administered 2021-04-13: 10 mg via INTRAMUSCULAR

## 2021-04-13 NOTE — ED Provider Notes (Signed)
[facility]    CSN: [id number] Arrival date & time: 04/13/21  1159      History   Chief Complaint Chief Complaint  Patient presents with  . Facial Pain    HPI David Melton is a 35 y.o. male.   Reports cough for the last week. Was seen in this office on 04/09/21 and was treated with Augmentin and tessalon perles. Reports that his cough is worse than it was when he was seen here over the weekend. Also reports recent loss of taste and smell. States that his congestion is also worse than it was before too. Denies fever, abdominal pain, nausea, vomiting, diarrhea, rash, other symptoms.   ROS per HPI  The history is provided by the patient.    History reviewed. No pertinent past medical history.  There are no problems to display for this patient.   Past Surgical History:  Procedure Laterality Date  . SKIN SURGERY     removal of fatty tissue.        Home Medications    Prior to Admission medications   Medication Sig Start Date End Date Taking? Authorizing Provider  guaiFENesin-codeine (ROBITUSSIN AC) 100-10 MG/5ML syrup Take 5 mLs by mouth 3 (three) times daily as needed for cough. 04/13/21  Yes Faustino Congress, NP  acetaminophen (TYLENOL) 500 MG tablet Take 500 mg by mouth as needed.    [provider]  amoxicillin-clavulanate (AUGMENTIN) 875-125 MG tablet Take 1 tablet by mouth 2 (two) times daily for 7 days. 04/11/21 04/18/21  Faustino Congress, NP  benzonatate (TESSALON) 100 MG capsule Take 1 capsule (100 mg total) by mouth every 8 (eight) hours. 04/11/21   Faustino Congress, NP  HYDROmorphone (DILAUDID) 2 MG tablet Take 2 mg by mouth every 4 (four) hours as needed.    [provider]  methocarbamol (ROBAXIN) 500 MG tablet Take 500 mg by mouth as needed.    [provider]    Family History Family History  Problem Relation Age of Onset  . Healthy Mother   . Kidney disease Father   . Heart failure Father     Social  History Social History   Tobacco Use  . Smoking status: Never Smoker  . Smokeless tobacco: Never Used  Substance Use Topics  . Alcohol use: No  . Drug use: No     Allergies   Patient has no known allergies.   Review of Systems Review of Systems   Physical Exam Triage Vital Signs ED Triage Vitals [04/13/21 1234]  Enc Vitals Group     BP 125/81     Pulse Rate 90     Resp 18     Temp 97.8 F (36.6 C)     Temp Source Oral     SpO2 97 %     Weight      Height      Head Circumference      Peak Flow      Pain Score      Pain Loc      Pain Edu?      Excl. in [place]?    No data found.  Updated Vital Signs BP 125/81 (BP Location: Right Arm)   Pulse 90   Temp 97.8 F (36.6 C) (Oral)   Resp 18   SpO2 97%   Visual Acuity Right Eye Distance:   Left Eye Distance:   Bilateral Distance:    Right Eye Near:   Left Eye Near:    Bilateral  Near:     Physical Exam Vitals and nursing note reviewed.  Constitutional:      General: He is not in acute distress.    Appearance: He is well-developed and normal weight. He is ill-appearing.  HENT:     Head: Normocephalic and atraumatic.     Right Ear: Tympanic membrane, ear canal and external ear normal.     Left Ear: Tympanic membrane, ear canal and external ear normal.     Nose: Nose normal.     Mouth/Throat:     Mouth: Mucous membranes are moist.     Pharynx: Oropharynx is clear. Posterior oropharyngeal erythema present.  Eyes:     Extraocular Movements: Extraocular movements intact.     Conjunctiva/sclera: Conjunctivae normal.     Pupils: Pupils are equal, round, and reactive to light.  Cardiovascular:     Rate and Rhythm: Normal rate and regular rhythm.     Heart sounds: Normal heart sounds. No murmur heard.   Pulmonary:     Effort: Pulmonary effort is normal. No respiratory distress.     Breath sounds: Normal breath sounds. No stridor. No wheezing, rhonchi or rales.  Chest:     Chest wall: No tenderness.   Abdominal:     General: Bowel sounds are normal. There is no distension.     Palpations: Abdomen is soft. There is no mass.     Tenderness: There is no abdominal tenderness. There is no right CVA tenderness, left CVA tenderness, guarding or rebound.     Hernia: No hernia is present.  Musculoskeletal:        General: Normal range of motion.     Cervical back: Normal range of motion and neck supple.  Lymphadenopathy:     Cervical: Cervical adenopathy present.  Skin:    General: Skin is warm and dry.     Capillary Refill: Capillary refill takes less than 2 seconds.  Neurological:     General: No focal deficit present.     Mental Status: He is alert and oriented to person, place, and time.  Psychiatric:        Mood and Affect: Mood normal.        Behavior: Behavior normal.        Thought Content: Thought content normal.      UC Treatments / Results  Labs (all labs ordered are listed, but only abnormal results are displayed) Labs Reviewed  COVID-19, FLU A+B NAA    EKG   Radiology No results found.  Procedures Procedures (including critical care time)  Medications Ordered in UC Medications  dexamethasone (DECADRON) injection 10 mg (10 mg Intramuscular Given 04/13/21 1302)    Initial Impression / Assessment and Plan / UC Course  I have reviewed the triage vital signs and the nursing notes.  Pertinent labs & imaging results that were available during my care of the patient were reviewed by me and considered in my medical decision making (see chart for details).    Viral Illness Loss of Taste Loss of Smell Cough  Decadron 10mg  IM in office today Cheratussin prescribed Sedation precautions given Covid and flu swab obtained in office today.   Patient instructed to quarantine until results are back and negative.   If results are negative, patient may resume daily schedule as tolerated once they are fever free for 24 hours without the use of antipyretic medications.    If results are positive, patient instructed to quarantine for at least 5 days from symptom onset.  If after 5 days symptoms have resolved, may return to work with a well fitting mask for the next 5 days. If symptomatic after day 5, isolation should be extended to 10 days. Patient instructed to follow-up with primary care or with this office as needed.   Patient instructed to follow-up in the ER for trouble swallowing, trouble breathing, other concerning symptoms.   Final Clinical Impressions(s) / UC Diagnoses   Final diagnoses:  Loss of taste  Viral illness  Loss of smell  Cough     Discharge Instructions     You have received a steroid injection in the office   Your COVID and Influenza tests are pending. You should self quarantine until the test results are back.    Take Tylenol or ibuprofen as needed for fever or discomfort. Rest and keep yourself hydrated.    Follow-up with your primary care provider if your symptoms are not improving.       ED Prescriptions    Medication Sig Dispense Auth. Provider   guaiFENesin-codeine (ROBITUSSIN AC) 100-10 MG/5ML syrup Take 5 mLs by mouth 3 (three) times daily as needed for cough. 120 mL Faustino Congress, NP     PDMP not reviewed this encounter.   Faustino Congress, NP 04/13/21 1504

## 2021-04-13 NOTE — Discharge Instructions (Addendum)
You have received a steroid injection in the office   Your COVID and Influenza tests are pending. You should self quarantine until the test results are back.    Take Tylenol or ibuprofen as needed for fever or discomfort. Rest and keep yourself hydrated.    Follow-up with your primary care provider if your symptoms are not improving.

## 2021-04-13 NOTE — ED Triage Notes (Signed)
Pt states he was here this weekend for sinus and cough has since gotten worse and also head and neck pressure has not improved.

## 2021-04-14 LAB — COVID-19, FLU A+B NAA
Influenza A, NAA: NOT DETECTED
Influenza B, NAA: NOT DETECTED
SARS-CoV-2, NAA: NOT DETECTED

## 2021-05-23 ENCOUNTER — Other Ambulatory Visit: Payer: Self-pay

## 2021-05-23 ENCOUNTER — Ambulatory Visit
Admission: RE | Admit: 2021-05-23 | Discharge: 2021-05-23 | Disposition: A | Discharge status: Home / Self Care | Payer: BC Managed Care – PPO | Source: Ambulatory Visit | Attending: Emergency Medicine | Admitting: Emergency Medicine

## 2021-05-23 VITALS — BP 143/77 | HR 97 | Temp 98.5°F | Resp 20

## 2021-05-23 DIAGNOSIS — J111 Influenza due to unidentified influenza virus with other respiratory manifestations: Secondary | ICD-10-CM

## 2021-05-23 DIAGNOSIS — Z20822 Contact with and (suspected) exposure to covid-19: Secondary | ICD-10-CM | POA: Diagnosis not present

## 2021-05-23 DIAGNOSIS — J069 Acute upper respiratory infection, unspecified: Secondary | ICD-10-CM | POA: Diagnosis not present

## 2021-05-23 MED ORDER — FLUTICASONE PROPIONATE 50 MCG/ACT NA SUSP: 2.0000 | Freq: Every day | NASAL | 0 refills | Status: AC

## 2021-05-23 MED ORDER — IBUPROFEN 600 MG PO TABS: 600.0000 mg | ORAL_TABLET | Freq: Four times a day (QID) | ORAL | 0 refills | Status: DC | PRN

## 2021-05-23 MED ORDER — BENZONATATE 200 MG PO CAPS: 200.0000 mg | ORAL_CAPSULE | Freq: Three times a day (TID) | ORAL | 0 refills | Status: DC | PRN

## 2021-05-23 MED ORDER — OSELTAMIVIR PHOSPHATE 75 MG PO CAPS: 75.0000 mg | ORAL_CAPSULE | Freq: Two times a day (BID) | ORAL | 0 refills | Status: DC

## 2021-05-23 NOTE — Discharge Instructions (Addendum)
Start the Tamiflu in case this is Tamiflu.  May discontinue if flu is negative.  Take 1000 mg of tylenol with the 600 mg motrin up to 3-4 times a day as needed for pain and fever. This is an effective combination. Drink extra fluids. Start taking mucinex daily to keep the mucus secretions thin. Use a neti pot or the NeilMed sinus rinse as often as you want with distilled water to to reduce nasal congestion. Follow the directions on the box.  Tessalon for the cough.  Flu and COVID will be back in 24 to 48 hours.  Go to www.goodrx.com  or www.costplusdrugs.com to look up your medications. This will give you a list of where you can find your prescriptions at the most affordable prices. Or ask the pharmacist what the cash price is, or if they have any other discount programs available to help make your medication more affordable. This can be less expensive than what you would pay with insurance.

## 2021-05-23 NOTE — ED Provider Notes (Signed)
HPI  SUBJECTIVE:  David Melton is a 35 y.o. male who presents with 2 to 3 days of body aches, headaches, cough right ear pain, nasal congestion, sinus pain and pressure after coming home from being at a camp.  No fevers, rhinorrhea, upper dental pain, facial swelling, postnasal drip, sore throat, loss of sense of smell or taste, shortness of breath, nausea, vomiting, diarrhea, abdominal pain.  No known exposure to COVID or flu.  He had a second dose of the COVID-vaccine.  He did not get the flu vaccine.  He states that the body aches are his primary complaint.  No known tick bite or rash.  He has tried Tylenol and Sudafed without much improvement in his symptoms.  No aggravating or alleviating factors.  Past medical history negative COVID.  He has no other medical problems.  [id number], Jenny Reichmann, MD  History reviewed. No pertinent past medical history.  Past Surgical History:  Procedure Laterality Date   SKIN SURGERY     removal of fatty tissue.     Family History  Problem Relation Age of Onset   Healthy Mother    Kidney disease Father    Heart failure Father     Social History   Tobacco Use   Smoking status: Never   Smokeless tobacco: Never  Substance Use Topics   Alcohol use: No   Drug use: No    No current facility-administered medications for this encounter.  Current Outpatient Medications:    benzonatate (TESSALON) 200 MG capsule, Take 1 capsule (200 mg total) by mouth 3 (three) times daily as needed for cough., Disp: 30 capsule, Rfl: 0   fluticasone (FLONASE) 50 MCG/ACT nasal spray, Place 2 sprays into both nostrils daily., Disp: 16 g, Rfl: 0   ibuprofen (ADVIL) 600 MG tablet, Take 1 tablet (600 mg total) by mouth every 6 (six) hours as needed., Disp: 30 tablet, Rfl: 0   oseltamivir (TAMIFLU) 75 MG capsule, Take 1 capsule (75 mg total) by mouth 2 (two) times daily. X 5 days, Disp: 10 capsule, Rfl: 0  No Known Allergies   ROS  As noted in HPI.   Physical Exam  BP  (!) 143/77   Pulse 97   Temp 98.5 F (36.9 C)   Resp 20   SpO2 97%   Constitutional: Well developed, well nourished, no acute distress Eyes:  EOMI, conjunctiva normal bilaterally HENT: Normocephalic, atraumatic,mucus membranes moist.  Positive erythematous, swollen turbinates with nasal congestion.  No maxillary, frontal sinus tenderness.  Tonsils surgically absent.  Positive cobblestoning.  No postnasal drip. Neck: No cervical adenopathy, meningismus Respiratory: Normal inspiratory effort, lungs clear bilaterally, good air movement Cardiovascular: Normal rate, regular rhythm, no murmurs rubs or gallop GI: nondistended skin: No rash, skin intact Musculoskeletal: no deformities Neurologic: Alert & oriented x 3, no focal neuro deficits Psychiatric: Speech and behavior appropriate   ED Course   Medications - No data to display  Orders Placed This Encounter  Procedures   Covid-19, Flu A+B ([facility])    Standing Status:   Standing    Number of Occurrences:   1   Results for orders placed or performed during the hospital encounter of 04/13/21  Covid-19, Flu A+B ([facility])   Specimen: Nasopharyngeal   Naso  Result Value Ref Range   SARS-CoV-2, NAA Not Detected Not Detected   Influenza A, NAA Not Detected Not Detected   Influenza B, NAA Not Detected Not Detected     No results found for this or any  previous visit (from the past 24 hour(s)). No results found.  ED Clinical Impression  1. Influenza-like illness   2. Encounter for laboratory testing for COVID-19 virus      ED Assessment/Plan  Flu, COVID sent.  Will start on Tamiflu because he will be out of the treatment window once his flu comes back.  He is to discontinue Tamiflu if flu is negative.  There will be nothing to do if his COVID is positive other than supportive treatment.  Considered tickborne disease, but given his symptoms of nasal congestion and cough, deferred testing for this today.  Follow-up with PMD in a  week if not getting better.   COVID, flu negative.  Sent patient message notifying her of negative results and advise him to discontinue Tamiflu.  Discussed labs, MDM, treatment plan, and plan for follow-up with patient. . patient agrees with plan.   Meds ordered this encounter  Medications   fluticasone (FLONASE) 50 MCG/ACT nasal spray    Sig: Place 2 sprays into both nostrils daily.    Dispense:  16 g    Refill:  0   oseltamivir (TAMIFLU) 75 MG capsule    Sig: Take 1 capsule (75 mg total) by mouth 2 (two) times daily. X 5 days    Dispense:  10 capsule    Refill:  0   ibuprofen (ADVIL) 600 MG tablet    Sig: Take 1 tablet (600 mg total) by mouth every 6 (six) hours as needed.    Dispense:  30 tablet    Refill:  0   benzonatate (TESSALON) 200 MG capsule    Sig: Take 1 capsule (200 mg total) by mouth 3 (three) times daily as needed for cough.    Dispense:  30 capsule    Refill:  0      *This clinic note was created using [employer]. Therefore, there may be occasional mistakes despite careful proofreading.  ?    Melynda Ripple, MD 05/24/21 [phone]

## 2021-05-23 NOTE — ED Triage Notes (Signed)
Pt presents with c/o headache , cough and chills since Friday

## 2021-05-24 ENCOUNTER — Encounter: Payer: Self-pay | Admitting: Emergency Medicine

## 2021-05-24 LAB — COVID-19, FLU A+B NAA
Influenza A, NAA: NOT DETECTED
Influenza B, NAA: NOT DETECTED
SARS-CoV-2, NAA: DETECTED — AB

## 2021-05-27 ENCOUNTER — Encounter: Payer: Self-pay | Admitting: Emergency Medicine

## 2021-09-01 ENCOUNTER — Ambulatory Visit: Payer: BC Managed Care – PPO

## 2021-09-01 DIAGNOSIS — S134XXA Sprain of ligaments of cervical spine, initial encounter: Secondary | ICD-10-CM | POA: Diagnosis not present

## 2021-09-01 DIAGNOSIS — S335XXA Sprain of ligaments of lumbar spine, initial encounter: Secondary | ICD-10-CM | POA: Diagnosis not present

## 2021-09-01 DIAGNOSIS — S233XXA Sprain of ligaments of thoracic spine, initial encounter: Secondary | ICD-10-CM | POA: Diagnosis not present

## 2021-09-02 DIAGNOSIS — S134XXA Sprain of ligaments of cervical spine, initial encounter: Secondary | ICD-10-CM | POA: Diagnosis not present

## 2021-09-02 DIAGNOSIS — S335XXA Sprain of ligaments of lumbar spine, initial encounter: Secondary | ICD-10-CM | POA: Diagnosis not present

## 2021-09-02 DIAGNOSIS — S233XXA Sprain of ligaments of thoracic spine, initial encounter: Secondary | ICD-10-CM | POA: Diagnosis not present

## 2021-09-03 DIAGNOSIS — S335XXA Sprain of ligaments of lumbar spine, initial encounter: Secondary | ICD-10-CM | POA: Diagnosis not present

## 2021-09-03 DIAGNOSIS — S233XXA Sprain of ligaments of thoracic spine, initial encounter: Secondary | ICD-10-CM | POA: Diagnosis not present

## 2021-09-03 DIAGNOSIS — S134XXA Sprain of ligaments of cervical spine, initial encounter: Secondary | ICD-10-CM | POA: Diagnosis not present

## 2021-10-14 DIAGNOSIS — D239 Other benign neoplasm of skin, unspecified: Secondary | ICD-10-CM | POA: Diagnosis not present

## 2021-10-14 DIAGNOSIS — L570 Actinic keratosis: Secondary | ICD-10-CM | POA: Diagnosis not present

## 2022-06-08 ENCOUNTER — Other Ambulatory Visit: Payer: Self-pay

## 2022-06-08 ENCOUNTER — Ambulatory Visit
Admission: RE | Admit: 2022-06-08 | Discharge: 2022-06-08 | Disposition: A | Discharge status: Home / Self Care | Payer: BC Managed Care – PPO | Source: Ambulatory Visit | Attending: Nurse Practitioner | Admitting: Nurse Practitioner

## 2022-06-08 VITALS — BP 129/85 | HR 85 | Temp 97.9°F | Resp 18

## 2022-06-08 DIAGNOSIS — J029 Acute pharyngitis, unspecified: Secondary | ICD-10-CM | POA: Insufficient documentation

## 2022-06-08 LAB — POCT RAPID STREP A (OFFICE): Rapid Strep A Screen: NEGATIVE

## 2022-06-08 MED ORDER — IBUPROFEN 800 MG PO TABS: 800.0000 mg | ORAL_TABLET | Freq: Three times a day (TID) | ORAL | 0 refills | Status: AC | PRN

## 2022-06-08 MED ORDER — PSEUDOEPH-BROMPHEN-DM 30-2-10 MG/5ML PO SYRP: 5.0000 mL | ORAL_SOLUTION | Freq: Four times a day (QID) | ORAL | 0 refills | Status: DC | PRN

## 2022-06-08 NOTE — Discharge Instructions (Addendum)
The rapid strep test is negative.  Throat culture/COVID/flu test are pending.  You will be contacted if the results are positive to provide treatment. Take medication as prescribed. Increase fluids and allow for plenty of rest. Recommend Tylenol or ibuprofen as needed for pain, fever, or general discomfort. Warm salt water gargles 3-4 times daily to help with throat pain or discomfort. Recommend using a humidifier at bedtime during sleep to help with cough and nasal congestion. Sleep elevated on 2 pillows while cough symptoms persist.. Follow-up if your symptoms do not improve within the next 7 to 10 days.Marland Kitchen

## 2022-06-08 NOTE — ED Provider Notes (Signed)
[facility]    CSN: [id number] Arrival date & time: 06/08/22  0835      History   Chief Complaint Chief Complaint  Patient presents with   Sore Throat    Entered by patient    HPI David Melton is a 36 y.o. male.   The history is provided by the patient.   Patient presents for complaints of sore throats been present for the past 2 to 3 days.  He states over the last 24 hours, he has also developed a productive cough.  Patient denies fever, chills, ear pain, headache, wheezing, shortness of breath, difficulty breathing, or GI symptoms.  Patient states that he is going away to camp with children and would like to make sure he is not contagious.  He has taken ibuprofen for his symptoms.  Denies any known sick contacts.  History reviewed. No pertinent past medical history.  There are no problems to display for this patient.   Past Surgical History:  Procedure Laterality Date   SKIN SURGERY     removal of fatty tissue.        Home Medications    Prior to Admission medications   Medication Sig Start Date End Date Taking? Authorizing Provider  brompheniramine-pseudoephedrine-DM 30-2-10 MG/5ML syrup Take 5 mLs by mouth 4 (four) times daily as needed. 06/08/22  Yes Azlaan Isidore-Warren, Alda Lea, NP  ibuprofen (ADVIL) 800 MG tablet Take 1 tablet (800 mg total) by mouth every 8 (eight) hours as needed. 06/08/22  Yes Becci Batty-Warren, Alda Lea, NP  benzonatate (TESSALON) 200 MG capsule Take 1 capsule (200 mg total) by mouth 3 (three) times daily as needed for cough. 05/23/21   Melynda Ripple, MD  fluticasone (FLONASE) 50 MCG/ACT nasal spray Place 2 sprays into both nostrils daily. 05/23/21   Melynda Ripple, MD  oseltamivir (TAMIFLU) 75 MG capsule Take 1 capsule (75 mg total) by mouth 2 (two) times daily. X 5 days 05/23/21   Melynda Ripple, MD    Family History Family History  Problem Relation Age of Onset   Healthy Mother    Kidney disease Father    Heart failure  Father     Social History Social History   Tobacco Use   Smoking status: Never   Smokeless tobacco: Never  Substance Use Topics   Alcohol use: No   Drug use: No     Allergies   Patient has no known allergies.   Review of Systems Review of Systems Per HPI  Physical Exam Triage Vital Signs ED Triage Vitals  Enc Vitals Group     BP 06/08/22 0844 129/85     Pulse Rate 06/08/22 0844 85     Resp 06/08/22 0844 18     Temp 06/08/22 0844 97.9 F (36.6 C)     Temp Source 06/08/22 0844 Oral     SpO2 06/08/22 0844 98 %     Weight --      Height --      Head Circumference --      Peak Flow --      Pain Score 06/08/22 0845 3     Pain Loc --      Pain Edu? --      Excl. in [place]? --    No data found.  Updated Vital Signs BP 129/85 (BP Location: Right Arm)   Pulse 85   Temp 97.9 F (36.6 C) (Oral)   Resp 18   SpO2 98%   Visual Acuity Right Eye Distance:  Left Eye Distance:   Bilateral Distance:    Right Eye Near:   Left Eye Near:    Bilateral Near:     Physical Exam Vitals and nursing note reviewed.  Constitutional:      General: He is not in acute distress.    Appearance: He is well-developed.  HENT:     Head: Normocephalic.     Right Ear: Tympanic membrane and ear canal normal.     Left Ear: Tympanic membrane and ear canal normal.     Mouth/Throat:     Pharynx: Uvula midline. Posterior oropharyngeal erythema present. No oropharyngeal exudate or uvula swelling.     Tonsils: No tonsillar exudate.  Eyes:     Conjunctiva/sclera: Conjunctivae normal.     Pupils: Pupils are equal, round, and reactive to light.  Cardiovascular:     Rate and Rhythm: Normal rate and regular rhythm.     Heart sounds: Normal heart sounds.  Pulmonary:     Effort: Pulmonary effort is normal. No respiratory distress.     Breath sounds: Normal breath sounds. No stridor. No wheezing, rhonchi or rales.  Abdominal:     General: Bowel sounds are normal.     Palpations: Abdomen is  soft.     Tenderness: There is no abdominal tenderness.  Musculoskeletal:     Cervical back: Normal range of motion and neck supple.  Lymphadenopathy:     Cervical: No cervical adenopathy.  Skin:    General: Skin is warm and dry.  Neurological:     General: No focal deficit present.     Mental Status: He is alert and oriented to person, place, and time.  Psychiatric:        Mood and Affect: Mood normal.        Behavior: Behavior normal.      UC Treatments / Results  Labs (all labs ordered are listed, but only abnormal results are displayed) Labs Reviewed  COVID-19, FLU A+B NAA  CULTURE, GROUP A STREP [facility])  POCT RAPID STREP A (OFFICE)    EKG   Radiology No results found.  Procedures Procedures (including critical care time)  Medications Ordered in UC Medications - No data to display  Initial Impression / Assessment and Plan / UC Course  I have reviewed the triage vital signs and the nursing notes.  Pertinent labs & imaging results that were available during my care of the patient were reviewed by me and considered in my medical decision making (see chart for details).  Patient presents for complaints of sore throats been present for the past 2 to 3 days.  On exam, patient has erythema of his oropharynx.  Patient has a history of tonsillectomy.  No cervical adenopathy is present.  Vital signs are stable, exam is otherwise benign.  Rapid strep test is negative.  Throat culture is pending along with COVID/flu test.  Symptoms appear to be consistent with viral upper respiratory infection at this time.  We will treat patient symptomatically with Bromfed.  Supportive care instructions were provided to the patient.  Patient advised to follow-up if symptoms do not improve. Final Clinical Impressions(s) / UC Diagnoses   Final diagnoses:  Acute pharyngitis, unspecified etiology     Discharge Instructions      The rapid strep test is negative.  Throat culture/COVID/flu  test are pending.  You will be contacted if the results are positive to provide treatment. Take medication as prescribed. Increase fluids and allow for plenty of rest. Recommend Tylenol  or ibuprofen as needed for pain, fever, or general discomfort. Warm salt water gargles 3-4 times daily to help with throat pain or discomfort. Recommend using a humidifier at bedtime during sleep to help with cough and nasal congestion. Sleep elevated on 2 pillows while cough symptoms persist.. Follow-up if your symptoms do not improve within the next 7 to 10 days..      ED Prescriptions     Medication Sig Dispense Auth. Provider   brompheniramine-pseudoephedrine-DM 30-2-10 MG/5ML syrup Take 5 mLs by mouth 4 (four) times daily as needed. 140 mL Lawernce Earll-Warren, Alda Lea, NP   ibuprofen (ADVIL) 800 MG tablet Take 1 tablet (800 mg total) by mouth every 8 (eight) hours as needed. 20 tablet Alonso Gapinski-Warren, Alda Lea, NP      PDMP not reviewed this encounter.   Tish Men, NP 06/08/22 [phone]

## 2022-06-08 NOTE — ED Triage Notes (Signed)
Pt reports sore throat, cough x2-3 days. Pt reports symptoms have progressively gotten worse. Denies any known fevers.

## 2022-06-09 LAB — COVID-19, FLU A+B NAA
Influenza A, NAA: NOT DETECTED
Influenza B, NAA: NOT DETECTED
SARS-CoV-2, NAA: NOT DETECTED

## 2022-06-10 LAB — CULTURE, GROUP A STREP (THRC)

## 2022-06-11 LAB — CULTURE, GROUP A STREP (THRC)

## 2022-09-28 DIAGNOSIS — Z6828 Body mass index (BMI) 28.0-28.9, adult: Secondary | ICD-10-CM | POA: Diagnosis not present

## 2022-09-28 DIAGNOSIS — E663 Overweight: Secondary | ICD-10-CM | POA: Diagnosis not present

## 2022-09-28 DIAGNOSIS — Z1331 Encounter for screening for depression: Secondary | ICD-10-CM | POA: Diagnosis not present

## 2022-09-28 DIAGNOSIS — F419 Anxiety disorder, unspecified: Secondary | ICD-10-CM | POA: Diagnosis not present

## 2022-09-28 DIAGNOSIS — R7309 Other abnormal glucose: Secondary | ICD-10-CM | POA: Diagnosis not present

## 2022-09-28 DIAGNOSIS — Z0001 Encounter for general adult medical examination with abnormal findings: Secondary | ICD-10-CM | POA: Diagnosis not present

## 2022-12-02 ENCOUNTER — Ambulatory Visit
Admission: RE | Admit: 2022-12-02 | Discharge: 2022-12-02 | Disposition: A | Discharge status: Home / Self Care | Payer: BC Managed Care – PPO | Source: Ambulatory Visit | Attending: Nurse Practitioner | Admitting: Nurse Practitioner

## 2022-12-02 VITALS — BP 118/84 | HR 87 | Temp 98.7°F | Resp 18

## 2022-12-02 DIAGNOSIS — J019 Acute sinusitis, unspecified: Secondary | ICD-10-CM

## 2022-12-02 DIAGNOSIS — B9689 Other specified bacterial agents as the cause of diseases classified elsewhere: Secondary | ICD-10-CM

## 2022-12-02 MED ORDER — AMOXICILLIN-POT CLAVULANATE 875-125 MG PO TABS: 1.0000 | ORAL_TABLET | Freq: Two times a day (BID) | ORAL | 0 refills | Status: AC

## 2022-12-02 NOTE — Discharge Instructions (Signed)
I believe you have a bacterial sinus infection.  Stop the Cefdinir and start Augmentin to treat it.   Some other things that can make you feel better are: - Increased rest - Increasing fluid with water/sugar free electrolytes - Acetaminophen and ibuprofen as needed for fever/pain - Salt water gargling, chloraseptic spray and throat lozenges for sore throat - OTC guaifenesin (Mucinex) 600 mg twice daily for congestion - Saline sinus flushes or a neti pot for congestion - Humidifying the air  Seek care if symptoms persist or worsen despite treatment

## 2022-12-02 NOTE — ED Provider Notes (Signed)
[facility]    CSN: [id number] Arrival date & time: 12/02/22  1346      History   Chief Complaint Chief Complaint  Patient presents with   Cough    I've been fight sickness for about 3 weeks. I visited an Urgent Care in [place] while on Christmas break. I've seen little to no improvement. - Entered by patient   Appointment    1400    HPI David Melton is a 37 y.o. male.   Patient presents today with 3-week history of congested cough, joint pains, chest congestion, sore throat that comes and goes and is worse in the morning, sinus pressure and headache, decreased appetite, and fatigue.  He denies recent fever, shortness of breath or chest pain, ear pain, abdominal pain, nausea/vomiting, diarrhea.  Reports his wife tested positive for strep throat the day after he was tested for COVID-19, influenza, and strep.  All of his testing was negative.  Reports his son had 103 degree fever this week.  He was seen in an urgent care in [place] and started on cefdinir which she has 1 day left of.  He has taken Mucinex 2 times which did not really help and he was not sure if he could take this with cefdinir so he did not take it regularly.  Patient denies other antibiotic use in the past 90 days.  He denies history of chronic lung disease.      History reviewed. No pertinent past medical history.  There are no problems to display for this patient.   Past Surgical History:  Procedure Laterality Date   SKIN SURGERY     removal of fatty tissue.        Home Medications    Prior to Admission medications   Medication Sig Start Date End Date Taking? Authorizing Provider  amoxicillin-clavulanate (AUGMENTIN) 875-125 MG tablet Take 1 tablet by mouth 2 (two) times daily for 7 days. 12/02/22 12/09/22 Yes Eulogio Bear, NP  fluticasone (FLONASE) 50 MCG/ACT nasal spray Place 2 sprays into both nostrils daily. 05/23/21   Melynda Ripple, MD  ibuprofen (ADVIL) 800 MG tablet  Take 1 tablet (800 mg total) by mouth every 8 (eight) hours as needed. 06/08/22   Leath-Warren, Alda Lea, NP    Family History Family History  Problem Relation Age of Onset   Healthy Mother    Kidney disease Father    Heart failure Father     Social History Social History   Tobacco Use   Smoking status: Never   Smokeless tobacco: Never  Substance Use Topics   Alcohol use: No   Drug use: No     Allergies   Patient has no known allergies.   Review of Systems Review of Systems Per HPI  Physical Exam Triage Vital Signs ED Triage Vitals  Enc Vitals Group     BP 12/02/22 1418 118/84     Pulse Rate 12/02/22 1418 87     Resp 12/02/22 1418 18     Temp 12/02/22 1418 98.7 F (37.1 C)     Temp Source 12/02/22 1418 Oral     SpO2 12/02/22 1418 98 %     Weight --      Height --      Head Circumference --      Peak Flow --      Pain Score 12/02/22 1422 0     Pain Loc --      Pain Edu? --  Excl. in [place]? --    No data found.  Updated Vital Signs BP 118/84 (BP Location: Right Arm)   Pulse 87   Temp 98.7 F (37.1 C) (Oral)   Resp 18   SpO2 98%   Visual Acuity Right Eye Distance:   Left Eye Distance:   Bilateral Distance:    Right Eye Near:   Left Eye Near:    Bilateral Near:     Physical Exam Vitals and nursing note reviewed.  Constitutional:      General: He is not in acute distress.    Appearance: Normal appearance. He is not ill-appearing or toxic-appearing.  HENT:     Head: Normocephalic and atraumatic.     Right Ear: Tympanic membrane, ear canal and external ear normal.     Left Ear: Tympanic membrane, ear canal and external ear normal.     Nose: Congestion present. No rhinorrhea.     Right Sinus: Maxillary sinus tenderness present. No frontal sinus tenderness.     Left Sinus: Maxillary sinus tenderness present. No frontal sinus tenderness.     Mouth/Throat:     Mouth: Mucous membranes are moist.     Pharynx: Oropharynx is clear. Posterior  oropharyngeal erythema present. No oropharyngeal exudate.     Comments: Cobblestoning of posterior pharynx Eyes:     General: No scleral icterus.    Extraocular Movements: Extraocular movements intact.  Cardiovascular:     Rate and Rhythm: Normal rate and regular rhythm.  Pulmonary:     Effort: Pulmonary effort is normal. No respiratory distress.     Breath sounds: Normal breath sounds. No wheezing, rhonchi or rales.  Abdominal:     General: Abdomen is flat. Bowel sounds are normal. There is no distension.     Palpations: Abdomen is soft.     Tenderness: There is no abdominal tenderness. There is no guarding.  Musculoskeletal:     Cervical back: Normal range of motion and neck supple.  Lymphadenopathy:     Cervical: No cervical adenopathy.  Skin:    General: Skin is warm and dry.     Coloration: Skin is not jaundiced or pale.     Findings: No erythema or rash.  Neurological:     Mental Status: He is alert and oriented to person, place, and time.  Psychiatric:        Behavior: Behavior is cooperative.      UC Treatments / Results  Labs (all labs ordered are listed, but only abnormal results are displayed) Labs Reviewed - No data to display  EKG   Radiology No results found.  Procedures Procedures (including critical care time)  Medications Ordered in UC Medications - No data to display  Initial Impression / Assessment and Plan / UC Course  I have reviewed the triage vital signs and the nursing notes.  Pertinent labs & imaging results that were available during my care of the patient were reviewed by me and considered in my medical decision making (see chart for details).   Patient is well-appearing, normotensive, afebrile, not tachycardic, not tachypneic, oxygenating well on room air.   Acute bacterial sinusitis Switch cefdinir to Augmentin and take it twice daily for 7 days Supportive care discussed including starting Mucinex, nasal saline rinses ER and return  precautions discussed Follow-up with PCP if no improvement symptoms despite treatment  The patient was given the opportunity to ask questions.  All questions answered to their satisfaction.  The patient is in agreement to this plan.  Final Clinical Impressions(s) / UC Diagnoses   Final diagnoses:  Acute bacterial sinusitis     Discharge Instructions      I believe you have a bacterial sinus infection.  Stop the Cefdinir and start Augmentin to treat it.   Some other things that can make you feel better are: - Increased rest - Increasing fluid with water/sugar free electrolytes - Acetaminophen and ibuprofen as needed for fever/pain - Salt water gargling, chloraseptic spray and throat lozenges for sore throat - OTC guaifenesin (Mucinex) 600 mg twice daily for congestion - Saline sinus flushes or a neti pot for congestion - Humidifying the air  Seek care if symptoms persist or worsen despite treatment     ED Prescriptions     Medication Sig Dispense Auth. Provider   amoxicillin-clavulanate (AUGMENTIN) 875-125 MG tablet Take 1 tablet by mouth 2 (two) times daily for 7 days. 14 tablet Eulogio Bear, NP      PDMP not reviewed this encounter.   Eulogio Bear, NP 12/02/22 1504

## 2022-12-02 NOTE — ED Triage Notes (Signed)
Pt reports cough, sore throat and joints soreness x 3 weeks.Visited an Urgent Care in [place] while on Christmas break. I've seen little to no improvement. Pt is taking antibiotic, tomorrow will be the last dose.

## 2022-12-26 DIAGNOSIS — D225 Melanocytic nevi of trunk: Secondary | ICD-10-CM | POA: Diagnosis not present

## 2022-12-26 DIAGNOSIS — L538 Other specified erythematous conditions: Secondary | ICD-10-CM | POA: Diagnosis not present

## 2022-12-26 DIAGNOSIS — Z789 Other specified health status: Secondary | ICD-10-CM | POA: Diagnosis not present

## 2022-12-26 DIAGNOSIS — L814 Other melanin hyperpigmentation: Secondary | ICD-10-CM | POA: Diagnosis not present

## 2022-12-26 DIAGNOSIS — L820 Inflamed seborrheic keratosis: Secondary | ICD-10-CM | POA: Diagnosis not present

## 2022-12-26 DIAGNOSIS — L821 Other seborrheic keratosis: Secondary | ICD-10-CM | POA: Diagnosis not present

## 2022-12-26 DIAGNOSIS — L298 Other pruritus: Secondary | ICD-10-CM | POA: Diagnosis not present

## 2023-04-25 DIAGNOSIS — J069 Acute upper respiratory infection, unspecified: Secondary | ICD-10-CM | POA: Diagnosis not present

## 2023-05-17 ENCOUNTER — Encounter: Payer: Self-pay | Admitting: Urology

## 2023-05-17 ENCOUNTER — Ambulatory Visit (INDEPENDENT_AMBULATORY_CARE_PROVIDER_SITE_OTHER): Payer: BC Managed Care – PPO | Admitting: Urology

## 2023-05-17 VITALS — BP 131/85 | HR 89

## 2023-05-17 DIAGNOSIS — Z3009 Encounter for other general counseling and advice on contraception: Secondary | ICD-10-CM

## 2023-05-17 MED ORDER — DIAZEPAM 10 MG PO TABS: 10.0000 mg | ORAL_TABLET | Freq: Once | ORAL | 0 refills | Status: AC

## 2023-05-17 NOTE — Progress Notes (Signed)
   05/17/2023 1:54 PM   David Melton [birth year] [id number]  Referring provider: Assunta Found, MD [address] [place],  [place] [address]  Desires sterilization   HPI: David Melton is a 36yo here for consideration of vasectomy. He has 3 healthy children. No scrotal surgeries. No significant LUTS   PMH: History reviewed. No pertinent past medical history.  Surgical History: Past Surgical History:  Procedure Laterality Date   SKIN SURGERY     removal of fatty tissue.     Home Medications:  Allergies as of 05/17/2023   No Known Allergies      Medication List        Accurate as of May 17, 2023  1:54 PM. If you have any questions, ask your nurse or doctor.          escitalopram 10 MG tablet Commonly known as: LEXAPRO Take 10 mg by mouth daily.   fluticasone 50 MCG/ACT nasal spray Commonly known as: FLONASE Place 2 sprays into both nostrils daily.   ibuprofen 800 MG tablet Commonly known as: ADVIL Take 1 tablet (800 mg total) by mouth every 8 (eight) hours as needed.        Allergies: No Known Allergies  Family History: Family History  Problem Relation Age of Onset   Healthy Mother    Kidney disease Father    Heart failure Father     Social History:  reports that he has never smoked. He has never used smokeless tobacco. He reports that he does not drink alcohol and does not use drugs.  ROS: All other review of systems were reviewed and are negative except what is noted above in HPI  Physical Exam: BP 131/85   Pulse 89   Constitutional:  Alert and oriented, No acute distress. HEENT: [place] AT, moist mucus membranes.  Trachea midline, no masses. Cardiovascular: No clubbing, cyanosis, or edema. Respiratory: Normal respiratory effort, no increased work of breathing. GI: Abdomen is soft, nontender, nondistended, no abdominal masses GU: No CVA tenderness. Bilateral vas deferens palpable Lymph: No cervical or inguinal lymphadenopathy. Skin: No  rashes, bruises or suspicious lesions. Neurologic: Grossly intact, no focal deficits, moving all 4 extremities. Psychiatric: Normal mood and affect.  Laboratory Data: No results found for: "WBC", "HGB", "HCT", "MCV", "PLT"  No results found for: "CREATININE"  No results found for: "PSA"  No results found for: "TESTOSTERONE"  No results found for: "HGBA1C"  Urinalysis No results found for: "COLORURINE", "APPEARANCEUR", "LABSPEC", "PHURINE", "GLUCOSEU", "HGBUR", "BILIRUBINUR", "KETONESUR", "PROTEINUR", "UROBILINOGEN", "NITRITE", "LEUKOCYTESUR"  No results found for: "LABMICR", "WBCUA", "RBCUA", "LABEPIT", "MUCUS", "BACTERIA"  Pertinent Imaging:  No results found for this or any previous visit.  No results found for this or any previous visit.  No results found for this or any previous visit.  No results found for this or any previous visit.  No results found for this or any previous visit.  No valid procedures specified. No results found for this or any previous visit.  No results found for this or any previous visit.   Assessment & Plan:    1. Vasectomy evaluation Schedule for vasectomy -rx for valium sent to pharmacy   No follow-ups on file.  Wilkie Aye, MD  [facility] [place]

## 2023-05-17 NOTE — Patient Instructions (Signed)
Vasectomy Vasectomy is a procedure in which the vas deferens is cut and then tied or burned (cauterized). The vas deferens is a tube that carries sperm from the testicle to the part of the body that drains urine from the bladder (urethra). This procedure blocks sperm from going through the vas deferens and penis during ejaculation. This ensures that sperm does not go into the vagina during sex. Vasectomy does not affect sexual desire or performance and does not prevent sexually transmitted infections. Vasectomy is considered a permanent and very effective form of birth control (contraception). The decision to have a vasectomy should not be made during a stressful time, such as after the loss of a pregnancy or a divorce. You and your partner should decide on whether to have a vasectomy when you are sure that you do not want children in the future. Tell a health care provider about: Any allergies you have. All medicines you are taking, including vitamins, herbs, eye drops, creams, and over-the-counter medicines. Any problems you or family members have had with anesthetic medicines. Any blood disorders you have. Any surgeries you have had. Any medical conditions you have. What are the risks? Generally, this is a safe procedure. However, problems may occur, including: Infection. Bleeding and swelling of the scrotum. The scrotum is the sac that contains the testicles, blood vessels, and structures that help deliver sperm and semen. Allergic reactions to medicines. Failure of the procedure to prevent pregnancy. There is a very small chance that the tied or cauterized ends of the vas deferens may reconnect (recanalization). If this happens, you could still make a woman pregnant. Pain in the scrotum that continues after you heal from the procedure. What happens before the procedure? Medicines Ask your health care provider about: Changing or stopping your regular medicines. This is especially important if  you are taking diabetes medicines or blood thinners. Taking medicines such as aspirin and ibuprofen. These medicines can thin your blood. Do not take these medicines unless your health care provider tells you to take them. Taking over-the-counter medicines, vitamins, herbs, and supplements. You may be told to take a medicine to help you relax (sedative) a few hours before the procedure. General instructions Do not use any products that contain nicotine or tobacco for at least 4 weeks before the procedure. These products include cigarettes, e-cigarettes, and chewing tobacco. If you need help quitting, ask your health care provider. Plan to have a responsible adult take you home from the hospital or clinic. If you will be going home right after the procedure, plan to have a responsible adult care for you for the time you are told. This is important. Ask your health care provider: How your surgery site will be marked. What steps will be taken to help prevent infection. These steps may include: Removing hair at the surgery site. Washing skin with a germ-killing soap. Taking antibiotic medicine. What happens during the procedure?  You will be given one or more of the following: A sedative, unless you were told to take this a few hours before the procedure. A medicine to numb the area (local anesthetic). Your health care provider will feel, or palpate, for your vas deferens. To reach the vas deferens, one of two methods may be used: A very small incision may be made in your scrotum. A punctured opening may be made in your scrotum, without an incision. Your vas deferens will be pulled out of your scrotum and cut. Then, the vas deferens will be closed   in one of two ways: Tied at the ends. Cauterized at the ends to seal them off. The vas deferens will be put back into your scrotum. The incision or puncture opening will be closed with absorbable stitches (sutures). The sutures will eventually  dissolve and will not need to be removed after the procedure. The procedure will be repeated on the other side of your scrotum. The procedure may vary among health care providers and hospitals. What happens after the procedure? You will be monitored to make sure that you do not have problems. You will be asked not to ejaculate for at least 1 week after the procedure, or for as long as you are told. You will need to use a different form of contraception for 2-4 months after the procedure, until you have test results confirming that there are no sperm in your semen. You may be given scrotal support to wear, such as a jockstrap or underwear with a supportive pouch. If you were given a sedative during the procedure, it can affect you for several hours. Do not drive or operate machinery until your health care provider says that it is safe. Summary Vasectomy blocks sperm from being released during ejaculation. This procedure is considered a permanent and very effective form of birth control. Your scrotum will be numbed with medicine (local anesthetic) for the procedure. After the procedure, you will be asked not to ejaculate for at least 1 week, or for as long as you are told. You will also need to use a different form of contraception until your test results confirm that there are no sperm in your semen. This information is not intended to replace advice given to you by your health care provider. Make sure you discuss any questions you have with your health care provider. Document Revised: 04/02/2020 Document Reviewed: 04/02/2020 [employer] Patient Education  2024 [employer]  

## 2023-05-31 DIAGNOSIS — U071 COVID-19: Secondary | ICD-10-CM | POA: Diagnosis not present

## 2023-06-21 ENCOUNTER — Ambulatory Visit (INDEPENDENT_AMBULATORY_CARE_PROVIDER_SITE_OTHER): Payer: BC Managed Care – PPO | Admitting: Urology

## 2023-06-21 VITALS — BP 149/99 | HR 86

## 2023-06-21 DIAGNOSIS — Z3009 Encounter for other general counseling and advice on contraception: Secondary | ICD-10-CM | POA: Diagnosis not present

## 2023-06-21 NOTE — Patient Instructions (Signed)
Vasectomy Postoperative Instructions ? ?Please bring back a semen analysis in approximately 3 months.  ?Your semen analysis  will need to be taken to  ?[facility]  [address], [place], [place] [address] ?[phone] ? ? You will be given a sterile specimen cup. Please label the cup with your name, date of birth, date and time of collections.  ?What to Expect ? - slight redness, swelling and scant drainage along the incision ? - mild to moderate discomfort ? - black and blue (bruising) as the tissue heals ? - low grade fever ? - scrotal sensitivity and/or tenderness ?- Edges of the incision may pull apart and heal slowly, sometimes a knot may be present which remains for several months.  This is NORMAL and all part of the healing process. ?- if stitches are placed, they do not need to be removed ?- if you have pain or discomfort immediately after the vasectomy, you may use OTC pain medication for relief , ex: tylenol.  After local anesthetic wears off an ice pack will provide additional comfort and can also prevent swelling if used ? ?Activity ? - no sexual intercourse for at lease 5 days depending on comfort ? - no heavy lifting for 48-72 hours (anything over 5-10 lbs) ? ?Wound Care ? - shower only after 24 hours ? - no tub baths, hot tub, or pools for at least 7 days ? - ice packs for 48 hours: 30 minutes on and 30 minutes off ? ?Problem to Report ? - generalized redness ? - increased pain and swelling ? - fever greater than 101 F ? - significant drainage or bleeding from the wound ? ?TO DO ?- Ejaculations help to clear the passage of sperm, but you must use another from of birth control until you are told you may discontinue its use!! ?- You will be given a specimen cup to bring back a semen sample in 3 months to check and see if its clear of sperm.  Only after the semen is sent for analysis and is reported back as clear should you use this as your primary form of birth control!    ?

## 2023-06-27 NOTE — Progress Notes (Signed)
06/21/2023  CC: desires sterilization   HPI: Mr David Melton is a 37yo here for vasectomy Blood pressure (!) 149/99, pulse 86. NED. A&Ox3.   No respiratory distress   Abd soft, NT, ND Normal external genitalia with patent urethral meatus  A timeout was performed.  Patient's identity and consent was confirmed.  All questions were answered.   Bilateral Vasectomy Procedure  Pre-Procedure: - Patient's scrotum was prepped and draped for vasectomy. - The vas was palpated through the scrotal skin on the left. - 1% Xylocaine was injected into the skin and surrounding tissue for placement  - In a similar manner, the vas on the right was identified, anesthetized, and stabilized.  Procedure: - A sharp hemostat was used to make a small stab incision in the skin overlying the vas - The left vas was isolated and brought up through the incision exposing that structure. - Bleeding points were cauterized as they occurred. - The vas was free from the surrounding structures and brought to the view. - A segment was positioned for placement with a hemostat. - A second hemostat was placed and a small segment between the two hemostats and was removed for inspection. - Each end of the transected vas lumen was fulgurated/ obliterated using needlepoint electrocautery -A fascial interposition was performed on testicular end of the vas using #3-0 chromic suture -The same procedure was performed on the right. - A single suture of #3-0 chromic catgut was used to close each lateral scrotal skin incision - A dressing was applied.  Post-Procedure: - Patient was instructed in care of the operative area - A specimen is to be delivered in 12 weeks   -Another form of contraception is to be used until post vasectomy semen analysis  David Aye, MD

## 2023-08-29 DIAGNOSIS — H35343 Macular cyst, hole, or pseudohole, bilateral: Secondary | ICD-10-CM | POA: Diagnosis not present

## 2023-10-06 DIAGNOSIS — R7309 Other abnormal glucose: Secondary | ICD-10-CM | POA: Diagnosis not present

## 2023-10-06 DIAGNOSIS — E6609 Other obesity due to excess calories: Secondary | ICD-10-CM | POA: Diagnosis not present

## 2023-10-06 DIAGNOSIS — Z0001 Encounter for general adult medical examination with abnormal findings: Secondary | ICD-10-CM | POA: Diagnosis not present

## 2023-10-06 DIAGNOSIS — Z1331 Encounter for screening for depression: Secondary | ICD-10-CM | POA: Diagnosis not present

## 2023-10-06 DIAGNOSIS — Z6831 Body mass index (BMI) 31.0-31.9, adult: Secondary | ICD-10-CM | POA: Diagnosis not present

## 2023-10-06 DIAGNOSIS — F419 Anxiety disorder, unspecified: Secondary | ICD-10-CM | POA: Diagnosis not present

## 2023-12-11 DIAGNOSIS — R0982 Postnasal drip: Secondary | ICD-10-CM | POA: Diagnosis not present

## 2023-12-11 DIAGNOSIS — R070 Pain in throat: Secondary | ICD-10-CM | POA: Diagnosis not present

## 2024-05-14 DIAGNOSIS — Z6828 Body mass index (BMI) 28.0-28.9, adult: Secondary | ICD-10-CM | POA: Diagnosis not present

## 2024-05-14 DIAGNOSIS — R7309 Other abnormal glucose: Secondary | ICD-10-CM | POA: Diagnosis not present

## 2024-05-14 DIAGNOSIS — E663 Overweight: Secondary | ICD-10-CM | POA: Diagnosis not present

## 2024-06-14 ENCOUNTER — Encounter: Payer: Self-pay | Admitting: Advanced Practice Midwife

## 2024-10-31 DIAGNOSIS — R7309 Other abnormal glucose: Secondary | ICD-10-CM | POA: Diagnosis not present

## 2024-10-31 DIAGNOSIS — Z0000 Encounter for general adult medical examination without abnormal findings: Secondary | ICD-10-CM | POA: Diagnosis not present

## 2024-10-31 DIAGNOSIS — F411 Generalized anxiety disorder: Secondary | ICD-10-CM | POA: Diagnosis not present

## 2024-10-31 DIAGNOSIS — E785 Hyperlipidemia, unspecified: Secondary | ICD-10-CM | POA: Diagnosis not present

## 2024-11-16 DIAGNOSIS — Z6829 Body mass index (BMI) 29.0-29.9, adult: Secondary | ICD-10-CM | POA: Diagnosis not present

## 2024-11-16 DIAGNOSIS — E663 Overweight: Secondary | ICD-10-CM | POA: Diagnosis not present

## 2024-11-16 DIAGNOSIS — J069 Acute upper respiratory infection, unspecified: Secondary | ICD-10-CM | POA: Diagnosis not present

## 2024-11-16 DIAGNOSIS — R030 Elevated blood-pressure reading, without diagnosis of hypertension: Secondary | ICD-10-CM | POA: Diagnosis not present
